# Patient Record
Sex: Female | Born: 1950
Health system: Southern US, Community
[De-identification: ages and names within clinical notes are randomized; demographics above are authoritative.]

## PROBLEM LIST (undated history)

## (undated) DIAGNOSIS — R350 Frequency of micturition: Secondary | ICD-10-CM

## (undated) DIAGNOSIS — R0600 Dyspnea, unspecified: Secondary | ICD-10-CM

## (undated) DIAGNOSIS — R011 Cardiac murmur, unspecified: Secondary | ICD-10-CM

## (undated) DIAGNOSIS — H9190 Unspecified hearing loss, unspecified ear: Secondary | ICD-10-CM

## (undated) DIAGNOSIS — Z8701 Personal history of pneumonia (recurrent): Secondary | ICD-10-CM

## (undated) DIAGNOSIS — F32A Depression, unspecified: Secondary | ICD-10-CM

## (undated) DIAGNOSIS — F329 Major depressive disorder, single episode, unspecified: Secondary | ICD-10-CM

## (undated) DIAGNOSIS — G8929 Other chronic pain: Secondary | ICD-10-CM

## (undated) DIAGNOSIS — R12 Heartburn: Secondary | ICD-10-CM

## (undated) DIAGNOSIS — R51 Headache: Secondary | ICD-10-CM

## (undated) DIAGNOSIS — F988 Other specified behavioral and emotional disorders with onset usually occurring in childhood and adolescence: Secondary | ICD-10-CM

## (undated) DIAGNOSIS — R519 Headache, unspecified: Secondary | ICD-10-CM

## (undated) DIAGNOSIS — I10 Essential (primary) hypertension: Secondary | ICD-10-CM

## (undated) DIAGNOSIS — E7800 Pure hypercholesterolemia, unspecified: Secondary | ICD-10-CM

## (undated) DIAGNOSIS — R32 Unspecified urinary incontinence: Secondary | ICD-10-CM

## (undated) DIAGNOSIS — R1310 Dysphagia, unspecified: Secondary | ICD-10-CM

## (undated) DIAGNOSIS — M1990 Unspecified osteoarthritis, unspecified site: Secondary | ICD-10-CM

## (undated) DIAGNOSIS — F419 Anxiety disorder, unspecified: Secondary | ICD-10-CM

## (undated) DIAGNOSIS — G4700 Insomnia, unspecified: Secondary | ICD-10-CM

## (undated) DIAGNOSIS — N390 Urinary tract infection, site not specified: Secondary | ICD-10-CM

## (undated) DIAGNOSIS — K589 Irritable bowel syndrome without diarrhea: Secondary | ICD-10-CM

## (undated) HISTORY — PX: CERVICAL SPINE SURGERY: SHX589

## (undated) HISTORY — PX: BREAST BIOPSY: SHX20

## (undated) HISTORY — PX: ABDOMINAL HYSTERECTOMY: SHX81

## (undated) HISTORY — PX: ROTATOR CUFF REPAIR: SHX139

## (undated) HISTORY — PX: OTHER SURGICAL HISTORY: SHX169

## (undated) HISTORY — PX: CARPAL TUNNEL RELEASE: SHX101

## (undated) HISTORY — PX: ESOPHAGOGASTRODUODENOSCOPY (EGD) WITH ESOPHAGEAL DILATION: SHX5812

---

## 2013-10-01 DIAGNOSIS — E7800 Pure hypercholesterolemia, unspecified: Secondary | ICD-10-CM | POA: Diagnosis not present

## 2013-10-01 DIAGNOSIS — F988 Other specified behavioral and emotional disorders with onset usually occurring in childhood and adolescence: Secondary | ICD-10-CM | POA: Diagnosis not present

## 2013-10-01 DIAGNOSIS — J45909 Unspecified asthma, uncomplicated: Secondary | ICD-10-CM | POA: Diagnosis not present

## 2013-10-01 DIAGNOSIS — Z23 Encounter for immunization: Secondary | ICD-10-CM | POA: Diagnosis not present

## 2013-10-01 DIAGNOSIS — Z1212 Encounter for screening for malignant neoplasm of rectum: Secondary | ICD-10-CM | POA: Diagnosis not present

## 2013-10-01 DIAGNOSIS — J449 Chronic obstructive pulmonary disease, unspecified: Secondary | ICD-10-CM | POA: Diagnosis not present

## 2013-10-01 DIAGNOSIS — E559 Vitamin D deficiency, unspecified: Secondary | ICD-10-CM | POA: Diagnosis not present

## 2013-10-01 DIAGNOSIS — I10 Essential (primary) hypertension: Secondary | ICD-10-CM | POA: Diagnosis not present

## 2013-10-01 DIAGNOSIS — Z0000 Encounter for general adult medical examination without abnormal findings: Secondary | ICD-10-CM | POA: Diagnosis not present

## 2013-10-09 DIAGNOSIS — Z780 Asymptomatic menopausal state: Secondary | ICD-10-CM | POA: Diagnosis not present

## 2013-10-09 DIAGNOSIS — Z1231 Encounter for screening mammogram for malignant neoplasm of breast: Secondary | ICD-10-CM | POA: Diagnosis not present

## 2013-10-09 DIAGNOSIS — N63 Unspecified lump in unspecified breast: Secondary | ICD-10-CM | POA: Diagnosis not present

## 2013-10-09 DIAGNOSIS — M810 Age-related osteoporosis without current pathological fracture: Secondary | ICD-10-CM | POA: Diagnosis not present

## 2013-10-22 DIAGNOSIS — R922 Inconclusive mammogram: Secondary | ICD-10-CM | POA: Diagnosis not present

## 2013-10-22 DIAGNOSIS — R928 Other abnormal and inconclusive findings on diagnostic imaging of breast: Secondary | ICD-10-CM | POA: Diagnosis not present

## 2013-10-29 DIAGNOSIS — Z803 Family history of malignant neoplasm of breast: Secondary | ICD-10-CM | POA: Diagnosis not present

## 2013-10-29 DIAGNOSIS — E663 Overweight: Secondary | ICD-10-CM | POA: Diagnosis not present

## 2013-10-29 DIAGNOSIS — R920 Mammographic microcalcification found on diagnostic imaging of breast: Secondary | ICD-10-CM | POA: Diagnosis not present

## 2013-11-14 DIAGNOSIS — H409 Unspecified glaucoma: Secondary | ICD-10-CM | POA: Diagnosis not present

## 2013-11-14 DIAGNOSIS — Z880 Allergy status to penicillin: Secondary | ICD-10-CM | POA: Diagnosis not present

## 2013-11-14 DIAGNOSIS — Z886 Allergy status to analgesic agent status: Secondary | ICD-10-CM | POA: Diagnosis not present

## 2013-11-14 DIAGNOSIS — R928 Other abnormal and inconclusive findings on diagnostic imaging of breast: Secondary | ICD-10-CM | POA: Diagnosis not present

## 2013-11-14 DIAGNOSIS — E7800 Pure hypercholesterolemia, unspecified: Secondary | ICD-10-CM | POA: Diagnosis not present

## 2013-11-14 DIAGNOSIS — F410 Panic disorder [episodic paroxysmal anxiety] without agoraphobia: Secondary | ICD-10-CM | POA: Diagnosis not present

## 2013-11-14 DIAGNOSIS — Z881 Allergy status to other antibiotic agents status: Secondary | ICD-10-CM | POA: Diagnosis not present

## 2013-11-14 DIAGNOSIS — F3289 Other specified depressive episodes: Secondary | ICD-10-CM | POA: Diagnosis not present

## 2013-11-14 DIAGNOSIS — I10 Essential (primary) hypertension: Secondary | ICD-10-CM | POA: Diagnosis not present

## 2013-11-14 DIAGNOSIS — Z79899 Other long term (current) drug therapy: Secondary | ICD-10-CM | POA: Diagnosis not present

## 2013-11-14 DIAGNOSIS — K589 Irritable bowel syndrome without diarrhea: Secondary | ICD-10-CM | POA: Diagnosis not present

## 2013-11-14 DIAGNOSIS — M129 Arthropathy, unspecified: Secondary | ICD-10-CM | POA: Diagnosis not present

## 2013-11-14 DIAGNOSIS — R0602 Shortness of breath: Secondary | ICD-10-CM | POA: Diagnosis not present

## 2013-11-14 DIAGNOSIS — F988 Other specified behavioral and emotional disorders with onset usually occurring in childhood and adolescence: Secondary | ICD-10-CM | POA: Diagnosis not present

## 2013-11-14 DIAGNOSIS — Z888 Allergy status to other drugs, medicaments and biological substances status: Secondary | ICD-10-CM | POA: Diagnosis not present

## 2013-11-14 DIAGNOSIS — Z90710 Acquired absence of both cervix and uterus: Secondary | ICD-10-CM | POA: Diagnosis not present

## 2013-11-14 DIAGNOSIS — M549 Dorsalgia, unspecified: Secondary | ICD-10-CM | POA: Diagnosis not present

## 2013-11-14 DIAGNOSIS — N6019 Diffuse cystic mastopathy of unspecified breast: Secondary | ICD-10-CM | POA: Diagnosis not present

## 2013-11-14 DIAGNOSIS — G43909 Migraine, unspecified, not intractable, without status migrainosus: Secondary | ICD-10-CM | POA: Diagnosis not present

## 2013-11-14 DIAGNOSIS — R920 Mammographic microcalcification found on diagnostic imaging of breast: Secondary | ICD-10-CM | POA: Diagnosis not present

## 2013-11-14 DIAGNOSIS — F329 Major depressive disorder, single episode, unspecified: Secondary | ICD-10-CM | POA: Diagnosis not present

## 2013-11-28 DIAGNOSIS — L258 Unspecified contact dermatitis due to other agents: Secondary | ICD-10-CM | POA: Diagnosis not present

## 2014-01-16 DIAGNOSIS — K219 Gastro-esophageal reflux disease without esophagitis: Secondary | ICD-10-CM | POA: Diagnosis not present

## 2014-03-17 DIAGNOSIS — F988 Other specified behavioral and emotional disorders with onset usually occurring in childhood and adolescence: Secondary | ICD-10-CM | POA: Diagnosis not present

## 2014-03-17 DIAGNOSIS — K219 Gastro-esophageal reflux disease without esophagitis: Secondary | ICD-10-CM | POA: Diagnosis not present

## 2014-03-17 DIAGNOSIS — I10 Essential (primary) hypertension: Secondary | ICD-10-CM | POA: Diagnosis not present

## 2014-03-17 DIAGNOSIS — Z09 Encounter for follow-up examination after completed treatment for conditions other than malignant neoplasm: Secondary | ICD-10-CM | POA: Diagnosis not present

## 2014-07-22 DIAGNOSIS — K219 Gastro-esophageal reflux disease without esophagitis: Secondary | ICD-10-CM | POA: Diagnosis not present

## 2014-07-22 DIAGNOSIS — F900 Attention-deficit hyperactivity disorder, predominantly inattentive type: Secondary | ICD-10-CM | POA: Diagnosis not present

## 2014-07-22 DIAGNOSIS — I10 Essential (primary) hypertension: Secondary | ICD-10-CM | POA: Diagnosis not present

## 2014-07-22 DIAGNOSIS — F419 Anxiety disorder, unspecified: Secondary | ICD-10-CM | POA: Diagnosis not present

## 2014-07-23 DIAGNOSIS — F419 Anxiety disorder, unspecified: Secondary | ICD-10-CM | POA: Diagnosis not present

## 2014-07-23 DIAGNOSIS — R51 Headache: Secondary | ICD-10-CM | POA: Diagnosis not present

## 2014-08-28 DIAGNOSIS — H409 Unspecified glaucoma: Secondary | ICD-10-CM | POA: Diagnosis not present

## 2014-08-28 DIAGNOSIS — M545 Low back pain: Secondary | ICD-10-CM | POA: Diagnosis not present

## 2014-08-28 DIAGNOSIS — K589 Irritable bowel syndrome without diarrhea: Secondary | ICD-10-CM | POA: Diagnosis not present

## 2014-08-28 DIAGNOSIS — E782 Mixed hyperlipidemia: Secondary | ICD-10-CM | POA: Diagnosis not present

## 2014-08-28 DIAGNOSIS — I10 Essential (primary) hypertension: Secondary | ICD-10-CM | POA: Diagnosis not present

## 2014-08-28 DIAGNOSIS — F419 Anxiety disorder, unspecified: Secondary | ICD-10-CM | POA: Diagnosis not present

## 2014-08-28 DIAGNOSIS — Z87898 Personal history of other specified conditions: Secondary | ICD-10-CM | POA: Diagnosis not present

## 2014-08-28 DIAGNOSIS — F900 Attention-deficit hyperactivity disorder, predominantly inattentive type: Secondary | ICD-10-CM | POA: Diagnosis not present

## 2014-08-28 DIAGNOSIS — H9313 Tinnitus, bilateral: Secondary | ICD-10-CM | POA: Diagnosis not present

## 2014-08-28 DIAGNOSIS — K1379 Other lesions of oral mucosa: Secondary | ICD-10-CM | POA: Diagnosis not present

## 2014-09-19 DIAGNOSIS — I10 Essential (primary) hypertension: Secondary | ICD-10-CM | POA: Diagnosis not present

## 2014-09-19 DIAGNOSIS — E782 Mixed hyperlipidemia: Secondary | ICD-10-CM | POA: Diagnosis not present

## 2014-10-23 DIAGNOSIS — R921 Mammographic calcification found on diagnostic imaging of breast: Secondary | ICD-10-CM | POA: Diagnosis not present

## 2014-10-23 DIAGNOSIS — Z803 Family history of malignant neoplasm of breast: Secondary | ICD-10-CM | POA: Diagnosis not present

## 2014-12-04 DIAGNOSIS — K1379 Other lesions of oral mucosa: Secondary | ICD-10-CM | POA: Diagnosis not present

## 2014-12-04 DIAGNOSIS — E782 Mixed hyperlipidemia: Secondary | ICD-10-CM | POA: Diagnosis not present

## 2014-12-04 DIAGNOSIS — M545 Low back pain: Secondary | ICD-10-CM | POA: Diagnosis not present

## 2014-12-04 DIAGNOSIS — I10 Essential (primary) hypertension: Secondary | ICD-10-CM | POA: Diagnosis not present

## 2014-12-04 DIAGNOSIS — F419 Anxiety disorder, unspecified: Secondary | ICD-10-CM | POA: Diagnosis not present

## 2015-01-29 DIAGNOSIS — H00012 Hordeolum externum right lower eyelid: Secondary | ICD-10-CM | POA: Diagnosis not present

## 2015-01-29 DIAGNOSIS — F419 Anxiety disorder, unspecified: Secondary | ICD-10-CM | POA: Diagnosis not present

## 2015-05-13 DIAGNOSIS — I10 Essential (primary) hypertension: Secondary | ICD-10-CM | POA: Diagnosis not present

## 2015-05-13 DIAGNOSIS — F419 Anxiety disorder, unspecified: Secondary | ICD-10-CM | POA: Diagnosis not present

## 2015-05-13 DIAGNOSIS — F900 Attention-deficit hyperactivity disorder, predominantly inattentive type: Secondary | ICD-10-CM | POA: Diagnosis not present

## 2015-05-13 DIAGNOSIS — K1379 Other lesions of oral mucosa: Secondary | ICD-10-CM | POA: Diagnosis not present

## 2015-05-13 DIAGNOSIS — M545 Low back pain: Secondary | ICD-10-CM | POA: Diagnosis not present

## 2015-05-13 DIAGNOSIS — E782 Mixed hyperlipidemia: Secondary | ICD-10-CM | POA: Diagnosis not present

## 2015-09-14 DIAGNOSIS — F419 Anxiety disorder, unspecified: Secondary | ICD-10-CM | POA: Diagnosis not present

## 2015-09-14 DIAGNOSIS — G4700 Insomnia, unspecified: Secondary | ICD-10-CM | POA: Diagnosis not present

## 2015-09-14 DIAGNOSIS — M545 Low back pain: Secondary | ICD-10-CM | POA: Diagnosis not present

## 2015-09-14 DIAGNOSIS — I10 Essential (primary) hypertension: Secondary | ICD-10-CM | POA: Diagnosis not present

## 2015-09-14 DIAGNOSIS — E782 Mixed hyperlipidemia: Secondary | ICD-10-CM | POA: Diagnosis not present

## 2015-09-14 DIAGNOSIS — K1379 Other lesions of oral mucosa: Secondary | ICD-10-CM | POA: Diagnosis not present

## 2015-09-14 DIAGNOSIS — F900 Attention-deficit hyperactivity disorder, predominantly inattentive type: Secondary | ICD-10-CM | POA: Diagnosis not present

## 2015-10-08 DIAGNOSIS — M545 Low back pain: Secondary | ICD-10-CM | POA: Diagnosis not present

## 2015-10-08 DIAGNOSIS — M6281 Muscle weakness (generalized): Secondary | ICD-10-CM | POA: Diagnosis not present

## 2015-10-08 DIAGNOSIS — M799 Soft tissue disorder, unspecified: Secondary | ICD-10-CM | POA: Diagnosis not present

## 2015-10-08 DIAGNOSIS — M62830 Muscle spasm of back: Secondary | ICD-10-CM | POA: Diagnosis not present

## 2016-04-25 DIAGNOSIS — E782 Mixed hyperlipidemia: Secondary | ICD-10-CM | POA: Diagnosis not present

## 2016-04-25 DIAGNOSIS — M25512 Pain in left shoulder: Secondary | ICD-10-CM | POA: Diagnosis not present

## 2016-04-25 DIAGNOSIS — F419 Anxiety disorder, unspecified: Secondary | ICD-10-CM | POA: Diagnosis not present

## 2016-04-25 DIAGNOSIS — B354 Tinea corporis: Secondary | ICD-10-CM | POA: Diagnosis not present

## 2016-04-25 DIAGNOSIS — I10 Essential (primary) hypertension: Secondary | ICD-10-CM | POA: Diagnosis not present

## 2016-04-25 DIAGNOSIS — F900 Attention-deficit hyperactivity disorder, predominantly inattentive type: Secondary | ICD-10-CM | POA: Diagnosis not present

## 2016-04-25 DIAGNOSIS — R1313 Dysphagia, pharyngeal phase: Secondary | ICD-10-CM | POA: Diagnosis not present

## 2016-04-25 DIAGNOSIS — F320 Major depressive disorder, single episode, mild: Secondary | ICD-10-CM | POA: Diagnosis not present

## 2016-04-25 DIAGNOSIS — R630 Anorexia: Secondary | ICD-10-CM | POA: Diagnosis not present

## 2016-05-01 DIAGNOSIS — N390 Urinary tract infection, site not specified: Secondary | ICD-10-CM | POA: Diagnosis not present

## 2016-05-01 DIAGNOSIS — R319 Hematuria, unspecified: Secondary | ICD-10-CM | POA: Diagnosis not present

## 2016-05-03 ENCOUNTER — Ambulatory Visit
Admission: RE | Admit: 2016-05-03 | Discharge: 2016-05-03 | Disposition: A | Discharge status: Home / Self Care | Payer: Medicare Other | Source: Ambulatory Visit | Attending: Family Medicine | Admitting: Family Medicine

## 2016-05-03 ENCOUNTER — Other Ambulatory Visit: Payer: Self-pay | Admitting: Family Medicine

## 2016-05-03 DIAGNOSIS — M19012 Primary osteoarthritis, left shoulder: Secondary | ICD-10-CM | POA: Diagnosis not present

## 2016-05-03 DIAGNOSIS — M25512 Pain in left shoulder: Secondary | ICD-10-CM

## 2016-05-18 DIAGNOSIS — M19012 Primary osteoarthritis, left shoulder: Secondary | ICD-10-CM | POA: Diagnosis not present

## 2016-05-27 DIAGNOSIS — M25512 Pain in left shoulder: Secondary | ICD-10-CM | POA: Diagnosis not present

## 2016-06-02 ENCOUNTER — Other Ambulatory Visit (HOSPITAL_COMMUNITY): Payer: Self-pay | Admitting: Gastroenterology

## 2016-06-02 DIAGNOSIS — R1314 Dysphagia, pharyngoesophageal phase: Secondary | ICD-10-CM | POA: Diagnosis not present

## 2016-06-02 DIAGNOSIS — Z1211 Encounter for screening for malignant neoplasm of colon: Secondary | ICD-10-CM | POA: Diagnosis not present

## 2016-06-02 DIAGNOSIS — R1312 Dysphagia, oropharyngeal phase: Secondary | ICD-10-CM | POA: Diagnosis not present

## 2016-06-07 DIAGNOSIS — M25512 Pain in left shoulder: Secondary | ICD-10-CM | POA: Diagnosis not present

## 2016-06-07 DIAGNOSIS — M19012 Primary osteoarthritis, left shoulder: Secondary | ICD-10-CM | POA: Diagnosis not present

## 2016-06-15 ENCOUNTER — Ambulatory Visit (HOSPITAL_COMMUNITY): Admission: RE | Admit: 2016-06-15 | Payer: Medicare Other | Source: Ambulatory Visit

## 2016-07-05 NOTE — H&P (Signed)
  Antionette CharKaren Deyarmin is an 65 y.o. female.    Chief Complaint: left shoulder pain  HPI: Pt is a 65 y.o. female complaining of left shoulder pain for multiple years. Pain had continually increased since the beginning. X-rays in the clinic show end-stage arthritic changes of the left shoulder. Pt has tried various conservative treatments which have failed to alleviate their symptoms, including injections and therapy. Various options are discussed with the patient. Risks, benefits and expectations were discussed with the patient. Patient understand the risks, benefits and expectations and wishes to proceed with surgery.   PCP:  No primary care provider on file.  D/C Plans: Home  PMH: No past medical history on file.  PSH: No past surgical history on file.  Social History:  has no tobacco, alcohol, and drug history on file.  Allergies:  Allergies not on file  Medications: No current facility-administered medications for this encounter.    No current outpatient prescriptions on file.    No results found for this or any previous visit (from the past 48 hour(s)). No results found.  ROS: Pain with rom of the left upper extremity  Physical Exam:  Alert and oriented 65 y.o. female in no acute distress Cranial nerves 2-12 intact Cervical spine: full rom with no tenderness, nv intact distally Chest: active breath sounds bilaterally, no wheeze rhonchi or rales Heart: regular rate and rhythm, no murmur Abd: non tender non distended with active bowel sounds Hip is stable with rom  Left shoulder moderate crepitus with rom nv intact distally No rashes or edema Strength of ER and IR 4/5   Assessment/Plan Assessment: left shoulder end stage osteoarthritis  Plan: Patient will undergo a left total shoulder arthroplasty by Dr. Ranell [facility]Norris at [facility]. Risks benefits and expectations were discussed with the patient. Patient understand risks, benefits and expectations and wishes to  proceed.

## 2016-07-06 ENCOUNTER — Ambulatory Visit (HOSPITAL_COMMUNITY): Payer: Medicare Other

## 2016-07-07 ENCOUNTER — Ambulatory Visit (HOSPITAL_COMMUNITY)
Admission: RE | Admit: 2016-07-07 | Discharge: 2016-07-07 | Disposition: A | Discharge status: Home / Self Care | Payer: Medicare Other | Source: Ambulatory Visit | Attending: Gastroenterology | Admitting: Gastroenterology

## 2016-07-07 ENCOUNTER — Encounter (HOSPITAL_COMMUNITY): Payer: Self-pay | Admitting: Radiology

## 2016-07-07 DIAGNOSIS — K224 Dyskinesia of esophagus: Secondary | ICD-10-CM | POA: Insufficient documentation

## 2016-07-07 DIAGNOSIS — R1314 Dysphagia, pharyngoesophageal phase: Secondary | ICD-10-CM | POA: Diagnosis not present

## 2016-07-11 ENCOUNTER — Encounter (HOSPITAL_COMMUNITY)
Admission: RE | Admit: 2016-07-11 | Discharge: 2016-07-11 | Disposition: A | Discharge status: Home / Self Care | Payer: Medicare Other | Source: Ambulatory Visit | Attending: Orthopedic Surgery | Admitting: Orthopedic Surgery

## 2016-07-11 ENCOUNTER — Encounter (HOSPITAL_COMMUNITY): Payer: Self-pay

## 2016-07-11 DIAGNOSIS — Z79899 Other long term (current) drug therapy: Secondary | ICD-10-CM | POA: Diagnosis not present

## 2016-07-11 DIAGNOSIS — F418 Other specified anxiety disorders: Secondary | ICD-10-CM | POA: Diagnosis not present

## 2016-07-11 DIAGNOSIS — K224 Dyskinesia of esophagus: Secondary | ICD-10-CM | POA: Diagnosis not present

## 2016-07-11 DIAGNOSIS — M19012 Primary osteoarthritis, left shoulder: Secondary | ICD-10-CM | POA: Diagnosis not present

## 2016-07-11 DIAGNOSIS — Z981 Arthrodesis status: Secondary | ICD-10-CM | POA: Diagnosis not present

## 2016-07-11 DIAGNOSIS — I10 Essential (primary) hypertension: Secondary | ICD-10-CM | POA: Diagnosis not present

## 2016-07-11 DIAGNOSIS — Z87891 Personal history of nicotine dependence: Secondary | ICD-10-CM | POA: Diagnosis not present

## 2016-07-11 HISTORY — DX: Insomnia, unspecified: G47.00

## 2016-07-11 HISTORY — DX: Frequency of micturition: R35.0

## 2016-07-11 HISTORY — DX: Anxiety disorder, unspecified: F41.9

## 2016-07-11 HISTORY — DX: Other chronic pain: G89.29

## 2016-07-11 HISTORY — DX: Depression, unspecified: F32.A

## 2016-07-11 HISTORY — DX: Irritable bowel syndrome without diarrhea: K58.9

## 2016-07-11 HISTORY — DX: Personal history of pneumonia (recurrent): Z87.01

## 2016-07-11 HISTORY — DX: Essential (primary) hypertension: I10

## 2016-07-11 HISTORY — DX: Heartburn: R12

## 2016-07-11 HISTORY — DX: Unspecified hearing loss, unspecified ear: H91.90

## 2016-07-11 HISTORY — DX: Headache: R51

## 2016-07-11 HISTORY — DX: Pure hypercholesterolemia, unspecified: E78.00

## 2016-07-11 HISTORY — DX: Headache, unspecified: R51.9

## 2016-07-11 HISTORY — DX: Unspecified osteoarthritis, unspecified site: M19.90

## 2016-07-11 HISTORY — DX: Major depressive disorder, single episode, unspecified: F32.9

## 2016-07-11 HISTORY — DX: Other specified behavioral and emotional disorders with onset usually occurring in childhood and adolescence: F98.8

## 2016-07-11 HISTORY — DX: Dysphagia, unspecified: R13.10

## 2016-07-11 HISTORY — DX: Urinary tract infection, site not specified: N39.0

## 2016-07-11 HISTORY — DX: Dyspnea, unspecified: R06.00

## 2016-07-11 HISTORY — DX: Cardiac murmur, unspecified: R01.1

## 2016-07-11 HISTORY — DX: Unspecified urinary incontinence: R32

## 2016-07-11 LAB — BASIC METABOLIC PANEL
Anion gap: 7 (ref 5–15)
BUN: 11 mg/dL (ref 6–20)
CALCIUM: 9.5 mg/dL (ref 8.9–10.3)
CO2: 26 mmol/L (ref 22–32)
CREATININE: 0.76 mg/dL (ref 0.44–1.00)
Chloride: 109 mmol/L (ref 101–111)
Glucose, Bld: 84 mg/dL (ref 65–99)
Potassium: 3.6 mmol/L (ref 3.5–5.1)
Sodium: 142 mmol/L (ref 135–145)

## 2016-07-11 LAB — SURGICAL PCR SCREEN
MRSA, PCR: NEGATIVE
Staphylococcus aureus: NEGATIVE

## 2016-07-11 LAB — CBC
HCT: 35.4 % — ABNORMAL LOW (ref 36.0–46.0)
Hemoglobin: 11.6 g/dL — ABNORMAL LOW (ref 12.0–15.0)
MCH: 29 pg (ref 26.0–34.0)
MCHC: 32.8 g/dL (ref 30.0–36.0)
MCV: 88.5 fL (ref 78.0–100.0)
PLATELETS: 334 10*3/uL (ref 150–400)
RBC: 4 MIL/uL (ref 3.87–5.11)
RDW: 13.3 % (ref 11.5–15.5)
WBC: 7 10*3/uL (ref 4.0–10.5)

## 2016-07-11 NOTE — Progress Notes (Signed)
PCP - Dr. Johny BlamerWilliam [name] Cardiologist - denies  EKG - 07/11/16 CXR - denies  Echo/stress test/cardiac cath - denies  Patient denies chest pain and shortness of breath at PAT appointment.

## 2016-07-11 NOTE — Progress Notes (Signed)
Anesthesia Chart Review: Patient is a 65 year old female scheduled for left total shoulder arthroplasty on 07/12/16 by Dr. Ranell PatrickNorris.  History includes former smoker, dysphagia (since 2000 ACDF and is s/p multiple "42" esophageal dilations), HTN, murmur (not specified), anxiety, depression, ADD, hypercholesterolemia, insomnia, IBS, chronic pain, dyspnea, hard of hearing (right ear), hysterectomy.  PCP is listed as Dr. Johny BlamerWilliam Harris. She previously lived in [place], and moved to [place] about three years ago.  Meds include Xanax, Adderall, atenolol, Cardizem CD, Lexapro, Estrace, Tricor, Norco, Levbid, magnesium, melatonin, fish oil, Ditropan, Zanaflex, Zantac, potassium, Sudafed PE.   BP 132/72   Pulse 60   Temp 36.6 C (Oral)   Resp 18   Ht 5\' 1"  (1.549 m)   Wt 118 lb 2.7 oz (53.6 kg)   SpO2 99%   BMI 22.33 kg/m   07/11/16 EKG: SB at 50 bpm, LAD.  07/07/16 Esophogram/Barium Swallow: IMPRESSION: 1. Significant esophageal dysmotility and moderate esophageal stasis. 2. No hiatal hernia or GE reflux. 3. Cervical fusion hardware in place but no complicating features. No upper esophageal stricture.  Preoperative labs noted.   She has a history of multiple esophageal dilations following ACDF. Reports she is prone to thick secretions, but is not acutely ill. Further evaluation on the day of surgery by her anesthesiologist to determine the definitive anesthesia plan.  Velna Ochsllison Zelenak, PA-C [facility] Short Stay Center/Anesthesiology Phone [phone] 07/11/2016 4:14 PM

## 2016-07-11 NOTE — Pre-Procedure Instructions (Signed)
Sydney [name]  07/11/2016      [id number] - [place], [place] - [address] [place] [address] Phone: [phone] Fax: [phone]    Your procedure is scheduled on Tuesday, December 5th, 2017.  [facility] Admitting at 10:30 A.M.   Call this number if you have problems the morning of surgery:  [phone]   Remember:  Do not eat food or drink liquids after midnight.   Take these medicines the morning of surgery with A SIP OF WATER: Acetaminophen (Tylenol) if needed, Alprazolam (Xanax), Atenolol (Tenormin), Diltiazem (Cardizem), Escitalopram (Lexapro), Hydrocodone-acetaminophen (Norco/Vicodin), Ranitidine (Zantac), Tizanidine (Zanaflex) if needed.  Stop taking: Phenylephrine (Sudafed PE), Aspirin, NSAIDS, Aleve, Naproxen, Ibuprofen, Advil, Motrin, BC's, Goody's, Fish oil, all herbal medications, and all vitamins.     Do not wear jewelry, make-up or nail polish.  Do not wear lotions, powders, or perfumes, or deoderant.  Do not shave 48 hours prior to surgery.   Do not bring valuables to the hospital.  [facility] is not responsible for any belongings or valuables.  Contacts, dentures or bridgework may not be worn into surgery.  Leave your suitcase in the car.  After surgery it may be brought to your room.  For patients admitted to the hospital, discharge time will be determined by your treatment team.  Patients discharged the day of surgery will not be allowed to drive home.   Special instructions:  Preparing for Surgery.   [place]- Preparing For Surgery  Before surgery, you can play an important role. Because skin is not sterile, your skin needs to be as free of germs as possible. You can reduce the number of germs on your skin by washing with CHG (chlorahexidine gluconate) Soap before surgery.  CHG is an antiseptic cleaner which kills germs and bonds with the skin to continue killing germs even after  washing.  Please do not use if you have an allergy to CHG or antibacterial soaps. If your skin becomes reddened/irritated stop using the CHG.  Do not shave (including legs and underarms) for at least 48 hours prior to first CHG shower. It is OK to shave your face.  Please follow these instructions carefully.   1. Shower the NIGHT BEFORE SURGERY and the MORNING OF SURGERY with CHG.   2. If you chose to wash your hair, wash your hair first as usual with your normal shampoo.  3. After you shampoo, rinse your hair and body thoroughly to remove the shampoo.  4. Use CHG as you would any other liquid soap. You can apply CHG directly to the skin and wash gently with a scrungie or a clean washcloth.   5. Apply the CHG Soap to your body ONLY FROM THE NECK DOWN.  Do not use on open wounds or open sores. Avoid contact with your eyes, ears, mouth and genitals (private parts). Wash genitals (private parts) with your normal soap.  6. Wash thoroughly, paying special attention to the area where your surgery will be performed.  7. Thoroughly rinse your body with warm water from the neck down.  8. DO NOT shower/wash with your normal soap after using and rinsing off the CHG Soap.  9. Pat yourself dry with a CLEAN TOWEL.   10. Wear CLEAN PAJAMAS   11. Place CLEAN SHEETS on your bed the night of your first shower and DO NOT SLEEP WITH PETS.  Day of Surgery: Do not apply any deodorants/lotions. Please wear clean  clothes to the hospital/surgery center.     Please read over the following fact sheets that you were given. MRSA Information

## 2016-07-12 ENCOUNTER — Encounter (HOSPITAL_COMMUNITY)
Admission: RE | Disposition: A | Discharge status: Home Health | Payer: Self-pay | Source: Ambulatory Visit | Attending: Orthopedic Surgery

## 2016-07-12 ENCOUNTER — Inpatient Hospital Stay (HOSPITAL_COMMUNITY): Payer: Medicare Other

## 2016-07-12 ENCOUNTER — Inpatient Hospital Stay (HOSPITAL_COMMUNITY): Payer: Medicare Other | Admitting: Vascular Surgery

## 2016-07-12 ENCOUNTER — Inpatient Hospital Stay (HOSPITAL_COMMUNITY)
Admission: RE | Admit: 2016-07-12 | Discharge: 2016-07-13 | DRG: 483 | Disposition: A | Discharge status: Home Health | Payer: Medicare Other | Source: Ambulatory Visit | Attending: Orthopedic Surgery | Admitting: Orthopedic Surgery

## 2016-07-12 DIAGNOSIS — K224 Dyskinesia of esophagus: Secondary | ICD-10-CM | POA: Diagnosis not present

## 2016-07-12 DIAGNOSIS — F418 Other specified anxiety disorders: Secondary | ICD-10-CM | POA: Diagnosis not present

## 2016-07-12 DIAGNOSIS — Z96612 Presence of left artificial shoulder joint: Secondary | ICD-10-CM

## 2016-07-12 DIAGNOSIS — Z981 Arthrodesis status: Secondary | ICD-10-CM | POA: Diagnosis not present

## 2016-07-12 DIAGNOSIS — M25512 Pain in left shoulder: Secondary | ICD-10-CM | POA: Diagnosis not present

## 2016-07-12 DIAGNOSIS — M19012 Primary osteoarthritis, left shoulder: Principal | ICD-10-CM | POA: Diagnosis present

## 2016-07-12 DIAGNOSIS — G8918 Other acute postprocedural pain: Secondary | ICD-10-CM | POA: Diagnosis not present

## 2016-07-12 DIAGNOSIS — I10 Essential (primary) hypertension: Secondary | ICD-10-CM | POA: Diagnosis not present

## 2016-07-12 DIAGNOSIS — Z471 Aftercare following joint replacement surgery: Secondary | ICD-10-CM | POA: Diagnosis not present

## 2016-07-12 DIAGNOSIS — R262 Difficulty in walking, not elsewhere classified: Secondary | ICD-10-CM

## 2016-07-12 DIAGNOSIS — Z79899 Other long term (current) drug therapy: Secondary | ICD-10-CM | POA: Diagnosis not present

## 2016-07-12 DIAGNOSIS — Z87891 Personal history of nicotine dependence: Secondary | ICD-10-CM | POA: Diagnosis not present

## 2016-07-12 HISTORY — PX: TOTAL SHOULDER ARTHROPLASTY: SHX126

## 2016-07-12 SURGERY — ARTHROPLASTY, SHOULDER, TOTAL
Anesthesia: Regional | Site: Shoulder | Laterality: Left

## 2016-07-12 MED ORDER — CHLORHEXIDINE GLUCONATE 4 % EX LIQD: 60.0000 mL | Freq: Once | CUTANEOUS | Status: DC

## 2016-07-12 MED ORDER — FENTANYL CITRATE (PF) 100 MCG/2ML IJ SOLN
INTRAMUSCULAR | Status: AC
  Filled 2016-07-12: qty 2

## 2016-07-12 MED ORDER — 0.9 % SODIUM CHLORIDE (POUR BTL) OPTIME
TOPICAL | Status: DC | PRN
  Administered 2016-07-12: 1000 mL

## 2016-07-12 MED ORDER — HYOSCYAMINE SULFATE ER 0.375 MG PO TB12
0.3750 mg | ORAL_TABLET | Freq: Two times a day (BID) | ORAL | Status: DC
  Administered 2016-07-12 – 2016-07-13 (×2): 0.375 mg via ORAL
  Filled 2016-07-12 (×3): qty 1

## 2016-07-12 MED ORDER — PHENYLEPHRINE HCL 10 MG/ML IJ SOLN
INTRAMUSCULAR | Status: AC
  Filled 2016-07-12: qty 2

## 2016-07-12 MED ORDER — ACETAMINOPHEN 500 MG PO TABS: 500.0000 mg | ORAL_TABLET | Freq: Four times a day (QID) | ORAL | Status: DC | PRN

## 2016-07-12 MED ORDER — VITAMIN D 1000 UNITS PO TABS
1000.0000 [IU] | ORAL_TABLET | Freq: Every day | ORAL | Status: DC
  Administered 2016-07-12 – 2016-07-13 (×2): 1000 [IU] via ORAL
  Filled 2016-07-12 (×2): qty 1

## 2016-07-12 MED ORDER — MIDAZOLAM HCL 2 MG/2ML IJ SOLN
INTRAMUSCULAR | Status: AC
  Filled 2016-07-12: qty 2

## 2016-07-12 MED ORDER — ESCITALOPRAM OXALATE 10 MG PO TABS
20.0000 mg | ORAL_TABLET | Freq: Every day | ORAL | Status: DC
  Administered 2016-07-13: 20 mg via ORAL
  Filled 2016-07-12: qty 2

## 2016-07-12 MED ORDER — BUPIVACAINE-EPINEPHRINE 0.25% -1:200000 IJ SOLN
INTRAMUSCULAR | Status: DC | PRN
  Administered 2016-07-12: 5 mL

## 2016-07-12 MED ORDER — ATENOLOL 50 MG PO TABS
50.0000 mg | ORAL_TABLET | Freq: Every day | ORAL | Status: DC
  Administered 2016-07-13: 50 mg via ORAL
  Filled 2016-07-12: qty 1

## 2016-07-12 MED ORDER — AMPHETAMINE-DEXTROAMPHETAMINE 10 MG PO TABS
10.0000 mg | ORAL_TABLET | Freq: Two times a day (BID) | ORAL | Status: DC
  Administered 2016-07-12: 10 mg via ORAL
  Filled 2016-07-12: qty 1

## 2016-07-12 MED ORDER — GELATIN ABSORBABLE 12-7 MM EX MISC
CUTANEOUS | Status: DC | PRN
  Administered 2016-07-12: 1

## 2016-07-12 MED ORDER — CEFAZOLIN SODIUM-DEXTROSE 2-4 GM/100ML-% IV SOLN
2.0000 g | INTRAVENOUS | Status: AC
  Administered 2016-07-12: 2 g via INTRAVENOUS

## 2016-07-12 MED ORDER — BUPIVACAINE-EPINEPHRINE (PF) 0.5% -1:200000 IJ SOLN
INTRAMUSCULAR | Status: DC | PRN
  Administered 2016-07-12: 20 mL via PERINEURAL

## 2016-07-12 MED ORDER — TIZANIDINE HCL 4 MG PO TABS: 4.0000 mg | ORAL_TABLET | Freq: Four times a day (QID) | ORAL | Status: DC | PRN

## 2016-07-12 MED ORDER — MAGNESIUM 200 MG PO TABS
200.0000 mg | ORAL_TABLET | Freq: Every day | ORAL | Status: DC
  Administered 2016-07-13: 200 mg via ORAL
  Filled 2016-07-12 (×2): qty 1

## 2016-07-12 MED ORDER — ALPRAZOLAM 0.25 MG PO TABS: 0.2500 mg | ORAL_TABLET | Freq: Two times a day (BID) | ORAL | Status: DC | PRN

## 2016-07-12 MED ORDER — MELATONIN 3 MG PO TABS
3.0000 mg | ORAL_TABLET | Freq: Every day | ORAL | Status: DC
  Administered 2016-07-12: 3 mg via ORAL
  Filled 2016-07-12 (×2): qty 1

## 2016-07-12 MED ORDER — CEFAZOLIN SODIUM-DEXTROSE 2-4 GM/100ML-% IV SOLN
2.0000 g | Freq: Four times a day (QID) | INTRAVENOUS | Status: AC
  Administered 2016-07-12 – 2016-07-13 (×3): 2 g via INTRAVENOUS
  Filled 2016-07-12 (×3): qty 100

## 2016-07-12 MED ORDER — GLYCOPYRROLATE 0.2 MG/ML IJ SOLN
INTRAMUSCULAR | Status: DC | PRN
  Administered 2016-07-12: 0.2 mg via INTRAVENOUS

## 2016-07-12 MED ORDER — ONDANSETRON HCL 4 MG PO TABS: 4.0000 mg | ORAL_TABLET | Freq: Four times a day (QID) | ORAL | Status: DC | PRN

## 2016-07-12 MED ORDER — AMPHETAMINE-DEXTROAMPHETAMINE 10 MG PO TABS: 10.0000 mg | ORAL_TABLET | ORAL | Status: DC

## 2016-07-12 MED ORDER — MIDAZOLAM HCL 2 MG/2ML IJ SOLN
INTRAMUSCULAR | Status: AC
  Administered 2016-07-12: 2 mg
  Filled 2016-07-12: qty 2

## 2016-07-12 MED ORDER — METOCLOPRAMIDE HCL 5 MG/ML IJ SOLN: 5.0000 mg | Freq: Three times a day (TID) | INTRAMUSCULAR | Status: DC | PRN

## 2016-07-12 MED ORDER — CEFAZOLIN SODIUM-DEXTROSE 2-4 GM/100ML-% IV SOLN
INTRAVENOUS | Status: AC
  Filled 2016-07-12: qty 100

## 2016-07-12 MED ORDER — MENTHOL 3 MG MT LOZG: 1.0000 | LOZENGE | OROMUCOSAL | Status: DC | PRN

## 2016-07-12 MED ORDER — FAMOTIDINE 20 MG PO TABS
20.0000 mg | ORAL_TABLET | Freq: Two times a day (BID) | ORAL | Status: DC
  Administered 2016-07-12 – 2016-07-13 (×2): 20 mg via ORAL
  Filled 2016-07-12 (×2): qty 1

## 2016-07-12 MED ORDER — ONDANSETRON HCL 4 MG/2ML IJ SOLN
INTRAMUSCULAR | Status: AC
  Filled 2016-07-12: qty 2

## 2016-07-12 MED ORDER — TIZANIDINE HCL 4 MG PO TABS: 4.0000 mg | ORAL_TABLET | Freq: Four times a day (QID) | ORAL | 0 refills | Status: AC | PRN

## 2016-07-12 MED ORDER — HYDROMORPHONE HCL 1 MG/ML IJ SOLN: 0.2500 mg | INTRAMUSCULAR | Status: DC | PRN

## 2016-07-12 MED ORDER — DILTIAZEM HCL ER COATED BEADS 240 MG PO CP24
240.0000 mg | ORAL_CAPSULE | Freq: Every day | ORAL | Status: DC
Start: 2016-07-13 — End: 2016-07-13
  Administered 2016-07-13: 240 mg via ORAL
  Filled 2016-07-12: qty 1

## 2016-07-12 MED ORDER — ADULT MULTIVITAMIN W/MINERALS CH
1.0000 | ORAL_TABLET | Freq: Every day | ORAL | Status: DC
  Administered 2016-07-12 – 2016-07-13 (×2): 1 via ORAL
  Filled 2016-07-12 (×2): qty 1

## 2016-07-12 MED ORDER — ALBUTEROL SULFATE HFA 108 (90 BASE) MCG/ACT IN AERS
INHALATION_SPRAY | RESPIRATORY_TRACT | Status: DC | PRN
  Administered 2016-07-12 (×2): 2 via RESPIRATORY_TRACT

## 2016-07-12 MED ORDER — ACETAMINOPHEN 650 MG RE SUPP: 650.0000 mg | Freq: Four times a day (QID) | RECTAL | Status: DC | PRN

## 2016-07-12 MED ORDER — POTASSIUM 99 MG PO TABS: 99.0000 mg | ORAL_TABLET | Freq: Every day | ORAL | Status: DC

## 2016-07-12 MED ORDER — GLYCOPYRROLATE 0.2 MG/ML IV SOSY
PREFILLED_SYRINGE | INTRAVENOUS | Status: AC
  Filled 2016-07-12: qty 3

## 2016-07-12 MED ORDER — ESTRADIOL 1 MG PO TABS
1.0000 mg | ORAL_TABLET | Freq: Every day | ORAL | Status: DC
  Administered 2016-07-13: 1 mg via ORAL
  Filled 2016-07-12: qty 1

## 2016-07-12 MED ORDER — ONDANSETRON HCL 4 MG/2ML IJ SOLN: 4.0000 mg | Freq: Four times a day (QID) | INTRAMUSCULAR | Status: DC | PRN

## 2016-07-12 MED ORDER — EPHEDRINE SULFATE 50 MG/ML IJ SOLN
INTRAMUSCULAR | Status: DC | PRN
  Administered 2016-07-12: 15 mg via INTRAVENOUS
  Administered 2016-07-12 (×2): 10 mg via INTRAVENOUS

## 2016-07-12 MED ORDER — ONDANSETRON HCL 4 MG/2ML IJ SOLN
INTRAMUSCULAR | Status: DC | PRN
Start: 2016-07-12 — End: 2016-07-12
  Administered 2016-07-12: 4 mg via INTRAVENOUS

## 2016-07-12 MED ORDER — NYSTATIN 100000 UNIT/GM EX CREA
TOPICAL_CREAM | Freq: Two times a day (BID) | CUTANEOUS | Status: DC
  Administered 2016-07-13: 10:00:00 via TOPICAL
  Filled 2016-07-12: qty 15

## 2016-07-12 MED ORDER — PROMETHAZINE HCL 25 MG/ML IJ SOLN: 6.2500 mg | INTRAMUSCULAR | Status: DC | PRN

## 2016-07-12 MED ORDER — DOCUSATE SODIUM 100 MG PO CAPS
100.0000 mg | ORAL_CAPSULE | Freq: Two times a day (BID) | ORAL | Status: DC
  Administered 2016-07-12 – 2016-07-13 (×2): 100 mg via ORAL
  Filled 2016-07-12 (×2): qty 1

## 2016-07-12 MED ORDER — SODIUM CHLORIDE 0.9 % IV SOLN
INTRAVENOUS | Status: DC
  Administered 2016-07-12: 18:00:00 via INTRAVENOUS

## 2016-07-12 MED ORDER — PHENYLEPHRINE HCL 10 MG PO TABS: 10.0000 mg | ORAL_TABLET | Freq: Every day | ORAL | Status: DC

## 2016-07-12 MED ORDER — THROMBIN 5000 UNITS EX SOLR
CUTANEOUS | Status: AC
  Filled 2016-07-12: qty 5000

## 2016-07-12 MED ORDER — ACETAMINOPHEN 325 MG PO TABS: 650.0000 mg | ORAL_TABLET | Freq: Four times a day (QID) | ORAL | Status: DC | PRN

## 2016-07-12 MED ORDER — OMEGA-3-ACID ETHYL ESTERS 1 G PO CAPS
1.0000 g | ORAL_CAPSULE | Freq: Every day | ORAL | Status: DC
  Administered 2016-07-12 – 2016-07-13 (×2): 1 g via ORAL
  Filled 2016-07-12 (×2): qty 1

## 2016-07-12 MED ORDER — PHENOL 1.4 % MT LIQD: 1.0000 | OROMUCOSAL | Status: DC | PRN

## 2016-07-12 MED ORDER — AMPHETAMINE-DEXTROAMPHETAMINE 10 MG PO TABS
15.0000 mg | ORAL_TABLET | Freq: Every day | ORAL | Status: DC
  Administered 2016-07-13: 15 mg via ORAL
  Filled 2016-07-12: qty 2

## 2016-07-12 MED ORDER — PROPOFOL 10 MG/ML IV BOLUS
INTRAVENOUS | Status: DC | PRN
  Administered 2016-07-12: 130 mg via INTRAVENOUS

## 2016-07-12 MED ORDER — FENOFIBRATE 160 MG PO TABS
160.0000 mg | ORAL_TABLET | Freq: Every day | ORAL | Status: DC
  Administered 2016-07-12 – 2016-07-13 (×2): 160 mg via ORAL
  Filled 2016-07-12 (×2): qty 1

## 2016-07-12 MED ORDER — HYDROCODONE-ACETAMINOPHEN 5-325 MG PO TABS: 1.0000 | ORAL_TABLET | ORAL | 0 refills | Status: AC | PRN

## 2016-07-12 MED ORDER — POLYETHYLENE GLYCOL 3350 17 G PO PACK: 17.0000 g | PACK | Freq: Every day | ORAL | Status: DC | PRN

## 2016-07-12 MED ORDER — METOCLOPRAMIDE HCL 5 MG PO TABS: 5.0000 mg | ORAL_TABLET | Freq: Three times a day (TID) | ORAL | Status: DC | PRN

## 2016-07-12 MED ORDER — LIDOCAINE HCL (CARDIAC) 20 MG/ML IV SOLN
INTRAVENOUS | Status: DC | PRN
  Administered 2016-07-12: 20 mg via INTRAVENOUS

## 2016-07-12 MED ORDER — MORPHINE SULFATE (PF) 2 MG/ML IV SOLN
2.0000 mg | INTRAVENOUS | Status: DC | PRN
  Administered 2016-07-13: 2 mg via INTRAVENOUS
  Filled 2016-07-12: qty 1

## 2016-07-12 MED ORDER — OXYBUTYNIN CHLORIDE 5 MG PO TABS
5.0000 mg | ORAL_TABLET | Freq: Two times a day (BID) | ORAL | Status: DC
  Administered 2016-07-12 – 2016-07-13 (×2): 5 mg via ORAL
  Filled 2016-07-12 (×2): qty 1

## 2016-07-12 MED ORDER — SIMETHICONE 80 MG PO CHEW: 80.0000 mg | CHEWABLE_TABLET | Freq: Four times a day (QID) | ORAL | Status: DC | PRN

## 2016-07-12 MED ORDER — PHENYLEPHRINE HCL 10 MG/ML IJ SOLN
INTRAMUSCULAR | Status: DC | PRN
Start: 2016-07-12 — End: 2016-07-12
  Administered 2016-07-12: 80 ug via INTRAVENOUS

## 2016-07-12 MED ORDER — LACTATED RINGERS IV SOLN
INTRAVENOUS | Status: DC
  Administered 2016-07-12 (×2): via INTRAVENOUS

## 2016-07-12 MED ORDER — THROMBIN 5000 UNITS EX SOLR
CUTANEOUS | Status: DC | PRN
  Administered 2016-07-12: 5000 [IU] via TOPICAL

## 2016-07-12 MED ORDER — FENTANYL CITRATE (PF) 100 MCG/2ML IJ SOLN
INTRAMUSCULAR | Status: AC
  Administered 2016-07-12: 100 ug
  Filled 2016-07-12: qty 2

## 2016-07-12 MED ORDER — SUCCINYLCHOLINE CHLORIDE 20 MG/ML IJ SOLN
INTRAMUSCULAR | Status: DC | PRN
  Administered 2016-07-12: 80 mg via INTRAVENOUS

## 2016-07-12 MED ORDER — HYDROCODONE-ACETAMINOPHEN 5-325 MG PO TABS
1.0000 | ORAL_TABLET | ORAL | Status: DC | PRN
  Administered 2016-07-12 – 2016-07-13 (×4): 2 via ORAL
  Filled 2016-07-12 (×4): qty 2

## 2016-07-12 MED ORDER — BUPIVACAINE-EPINEPHRINE (PF) 0.25% -1:200000 IJ SOLN
INTRAMUSCULAR | Status: AC
  Filled 2016-07-12: qty 30

## 2016-07-12 MED ORDER — SODIUM CHLORIDE 0.9 % IV SOLN
INTRAVENOUS | Status: DC | PRN
Start: 2016-07-12 — End: 2016-07-12
  Administered 2016-07-12: 30 ug/min via INTRAVENOUS

## 2016-07-12 SURGICAL SUPPLY — 81 items
BIT DRILL 5/64X5 DISP (BIT) ×3 IMPLANT
BLADE SAW SAG 73X25 THK (BLADE) ×2
BLADE SAW SGTL 73X25 THK (BLADE) ×1 IMPLANT
BOWL SMART MIX CTS (DISPOSABLE) ×3 IMPLANT
BUR SURG 4X8 MED (BURR) IMPLANT
BURR SURG 4MMX8MM MEDIUM (BURR)
BURR SURG 4X8 MED (BURR)
CAPT HIP TOTAL 2 ×3 IMPLANT
CEMENT BONE DEPUY (Cement) ×3 IMPLANT
CLOSURE STERI-STRIP 1/2X4 (GAUZE/BANDAGES/DRESSINGS) ×1
CLOSURE WOUND 1/2 X4 (GAUZE/BANDAGES/DRESSINGS) ×1
CLSR STERI-STRIP ANTIMIC 1/2X4 (GAUZE/BANDAGES/DRESSINGS) ×2 IMPLANT
COVER SURGICAL LIGHT HANDLE (MISCELLANEOUS) ×3 IMPLANT
DRAPE IMP U-DRAPE 54X76 (DRAPES) ×3 IMPLANT
DRAPE INCISE IOBAN 66X45 STRL (DRAPES) ×9 IMPLANT
DRAPE ORTHO SPLIT 77X108 STRL (DRAPES) ×4
DRAPE SURG ORHT 6 SPLT 77X108 (DRAPES) ×2 IMPLANT
DRAPE U-SHAPE 47X51 STRL (DRAPES) ×3 IMPLANT
DRAPE X-RAY CASS 24X20 (DRAPES) IMPLANT
DRSG ADAPTIC 3X8 NADH LF (GAUZE/BANDAGES/DRESSINGS) ×3 IMPLANT
DRSG PAD ABDOMINAL 8X10 ST (GAUZE/BANDAGES/DRESSINGS) ×6 IMPLANT
DURAPREP 26ML APPLICATOR (WOUND CARE) ×3 IMPLANT
ELECT BLADE 4.0 EZ CLEAN MEGAD (MISCELLANEOUS) ×3
ELECT NEEDLE TIP 2.8 STRL (NEEDLE) ×3 IMPLANT
ELECT REM PT RETURN 9FT ADLT (ELECTROSURGICAL) ×3
ELECTRODE BLDE 4.0 EZ CLN MEGD (MISCELLANEOUS) ×1 IMPLANT
ELECTRODE REM PT RTRN 9FT ADLT (ELECTROSURGICAL) ×1 IMPLANT
GAUZE SPONGE 4X4 12PLY STRL (GAUZE/BANDAGES/DRESSINGS) ×3 IMPLANT
GLOVE BIOGEL PI ORTHO PRO 7.5 (GLOVE) ×2
GLOVE BIOGEL PI ORTHO PRO SZ7 (GLOVE) ×2
GLOVE BIOGEL PI ORTHO PRO SZ8 (GLOVE) ×2
GLOVE ORTHO TXT STRL SZ7.5 (GLOVE) ×3 IMPLANT
GLOVE PI ORTHO PRO STRL 7.5 (GLOVE) ×1 IMPLANT
GLOVE PI ORTHO PRO STRL SZ7 (GLOVE) ×1 IMPLANT
GLOVE PI ORTHO PRO STRL SZ8 (GLOVE) ×1 IMPLANT
GLOVE SURG ORTHO 8.5 STRL (GLOVE) ×6 IMPLANT
GLOVE SURG SS PI 7.0 STRL IVOR (GLOVE) ×3 IMPLANT
GOWN STRL REUS W/ TWL XL LVL3 (GOWN DISPOSABLE) ×3 IMPLANT
GOWN STRL REUS W/TWL XL LVL3 (GOWN DISPOSABLE) ×6
HANDPIECE INTERPULSE COAX TIP (DISPOSABLE)
KIT BASIN OR (CUSTOM PROCEDURE TRAY) ×3 IMPLANT
KIT ROOM TURNOVER OR (KITS) ×3 IMPLANT
MANIFOLD NEPTUNE II (INSTRUMENTS) ×3 IMPLANT
NDL SUT 6 .5 CRC .975X.05 MAYO (NEEDLE) ×1 IMPLANT
NEEDLE 1/2 CIR MAYO (NEEDLE) ×3 IMPLANT
NEEDLE HYPO 25GX1X1/2 BEV (NEEDLE) ×3 IMPLANT
NEEDLE MAYO TAPER (NEEDLE) ×2
NS IRRIG 1000ML POUR BTL (IV SOLUTION) ×3 IMPLANT
PACK SHOULDER (CUSTOM PROCEDURE TRAY) ×3 IMPLANT
PACK UNIVERSAL I (CUSTOM PROCEDURE TRAY) ×3 IMPLANT
PAD ABD 8X10 STRL (GAUZE/BANDAGES/DRESSINGS) ×3 IMPLANT
PAD ARMBOARD 7.5X6 YLW CONV (MISCELLANEOUS) ×3 IMPLANT
PIN METAGLENE 2.5 (PIN) ×3 IMPLANT
SET HNDPC FAN SPRY TIP SCT (DISPOSABLE) IMPLANT
SLING ARM IMMOBILIZER LRG (SOFTGOODS) IMPLANT
SLING ARM IMMOBILIZER MED (SOFTGOODS) ×3 IMPLANT
SMARTMIX MINI TOWER (MISCELLANEOUS) ×3
SPONGE LAP 18X18 X RAY DECT (DISPOSABLE) ×3 IMPLANT
SPONGE LAP 4X18 X RAY DECT (DISPOSABLE) ×3 IMPLANT
SPONGE SURGIFOAM ABS GEL SZ50 (HEMOSTASIS) ×3 IMPLANT
STRIP CLOSURE SKIN 1/2X4 (GAUZE/BANDAGES/DRESSINGS) ×2 IMPLANT
SUCTION FRAZIER HANDLE 10FR (MISCELLANEOUS) ×2
SUCTION TUBE FRAZIER 10FR DISP (MISCELLANEOUS) ×1 IMPLANT
SUT BONE WAX W31G (SUTURE) ×3 IMPLANT
SUT FIBERWIRE #2 38 T-5 BLUE (SUTURE) ×6
SUT MNCRL AB 4-0 PS2 18 (SUTURE) ×3 IMPLANT
SUT VIC AB 0 CT1 27 (SUTURE) ×2
SUT VIC AB 0 CT1 27XBRD ANBCTR (SUTURE) ×1 IMPLANT
SUT VIC AB 0 CT2 27 (SUTURE) ×3 IMPLANT
SUT VIC AB 2-0 CT1 27 (SUTURE) ×2
SUT VIC AB 2-0 CT1 TAPERPNT 27 (SUTURE) ×1 IMPLANT
SUT VICRYL AB 2 0 TIES (SUTURE) ×3 IMPLANT
SUTURE FIBERWR #2 38 T-5 BLUE (SUTURE) ×2 IMPLANT
SYR CONTROL 10ML LL (SYRINGE) ×3 IMPLANT
TAPE CLOTH SURG 6X10 WHT LF (GAUZE/BANDAGES/DRESSINGS) ×3 IMPLANT
TOWEL OR 17X24 6PK STRL BLUE (TOWEL DISPOSABLE) ×3 IMPLANT
TOWEL OR 17X26 10 PK STRL BLUE (TOWEL DISPOSABLE) ×3 IMPLANT
TOWER SMARTMIX MINI (MISCELLANEOUS) ×1 IMPLANT
TRAY FOLEY CATH 16FRSI W/METER (SET/KITS/TRAYS/PACK) IMPLANT
WATER STERILE IRR 1000ML POUR (IV SOLUTION) ×3 IMPLANT
YANKAUER SUCT BULB TIP NO VENT (SUCTIONS) ×3 IMPLANT

## 2016-07-12 NOTE — Anesthesia Procedure Notes (Signed)
Anesthesia Regional Block:  Interscalene brachial plexus block  Pre-Anesthetic Checklist: ,, timeout performed, Correct Patient, Correct Site, Correct Laterality, Correct Procedure, Correct Position, site marked, Risks and benefits discussed,  Surgical consent,  Pre-op evaluation,  At surgeon's request and post-op pain management  Laterality: Left  Prep: chloraprep       Needles:  Injection technique: Single-shot  Needle Type: Echogenic Needle     Needle Length: 9cm 9 cm Needle Gauge: 21 and 21 G    Additional Needles:  Procedures: ultrasound guided (picture in chart) and nerve stimulator Interscalene brachial plexus block  Nerve Stimulator or Paresthesia:  Response: deltoid and biceps,   Additional Responses:   Narrative:  Start time: 07/12/2016 11:52 AM End time: 07/12/2016 11:59 AM Injection made incrementally with aspirations every 5 mL.  Performed by: Personally  Anesthesiologist: Marcene DuosFITZGERALD, Rajat Staver

## 2016-07-12 NOTE — Transfer of Care (Signed)
Immediate Anesthesia Transfer of Care Note  Patient: Sydney [name]  Procedure(s) Performed: Procedure(s): LEFT TOTAL SHOULDER ARTHROPLASTY (Left)  Patient Location: PACU  Anesthesia Type:General  Level of Consciousness: awake, alert  and patient cooperative  Airway & Oxygen Therapy: Patient Spontanous Breathing  Post-op Assessment: Report given to RN and Post -op Vital signs reviewed and stable  Post vital signs: Reviewed  Last Vitals:  Vitals:   07/12/16 1200 07/12/16 1203  BP: (!) 178/86 (!) 168/64  Pulse: (!) 57 (!) 55  Resp: 16 12  Temp:      Last Pain:  Vitals:   07/12/16 1053  TempSrc: Oral         Complications: No apparent anesthesia complications

## 2016-07-12 NOTE — Anesthesia Procedure Notes (Signed)
Procedure Name: Intubation Date/Time: 07/12/2016 12:39 PM Performed by: Rosiland OzMEYERS, Sacoya Mcgourty Pre-anesthesia Checklist: Patient identified, Emergency Drugs available, Suction available, Patient being monitored and Timeout performed Patient Re-evaluated:Patient Re-evaluated prior to inductionOxygen Delivery Method: Circle system utilized Preoxygenation: Pre-oxygenation with 100% oxygen Intubation Type: IV induction Ventilation: Mask ventilation without difficulty Laryngoscope Size: Miller and 3 Grade View: Grade II Tube type: Oral Tube size: 7.0 mm Number of attempts: 1 Airway Equipment and Method: Stylet Placement Confirmation: ETT inserted through vocal cords under direct vision,  positive ETCO2 and breath sounds checked- equal and bilateral Secured at: 21 cm Tube secured with: Tape Dental Injury: Teeth and Oropharynx as per pre-operative assessment

## 2016-07-12 NOTE — Anesthesia Preprocedure Evaluation (Addendum)
Anesthesia Evaluation  Patient identified by MRN, date of birth, ID band Patient awake    Reviewed: Allergy & Precautions, NPO status , Patient's Chart, lab work & pertinent test results, reviewed documented beta blocker date and time   Airway Mallampati: II  TM Distance: >3 FB Neck ROM: Full    Dental  (+) Dental Advisory Given   Pulmonary former smoker,    breath sounds clear to auscultation       Cardiovascular hypertension, Pt. on medications and Pt. on home beta blockers  Rhythm:Regular Rate:Normal     Neuro/Psych Anxiety Depression negative neurological ROS     GI/Hepatic Neg liver ROS, Esophageal dysmotility   Endo/Other  negative endocrine ROS  Renal/GU negative Renal ROS     Musculoskeletal  (+) Arthritis ,   Abdominal   Peds  Hematology negative hematology ROS (+)   Anesthesia Other Findings   Reproductive/Obstetrics                            Lab Results  Component Value Date   WBC 7.0 07/11/2016   HGB 11.6 (L) 07/11/2016   HCT 35.4 (L) 07/11/2016   MCV 88.5 07/11/2016   PLT 334 07/11/2016   Lab Results  Component Value Date   CREATININE 0.76 07/11/2016   BUN 11 07/11/2016   NA 142 07/11/2016   K 3.6 07/11/2016   CL 109 07/11/2016   CO2 26 07/11/2016    Anesthesia Physical Anesthesia Plan  ASA: II  Anesthesia Plan: General and Regional   Post-op Pain Management:  Regional for Post-op pain   Induction: Intravenous  Airway Management Planned: Oral ETT  Additional Equipment:   Intra-op Plan:   Post-operative Plan: Extubation in OR  Informed Consent: I have reviewed the patients History and Physical, chart, labs and discussed the procedure including the risks, benefits and alternatives for the proposed anesthesia with the patient or authorized representative who has indicated his/her understanding and acceptance.   Dental advisory given  Plan Discussed  with: CRNA  Anesthesia Plan Comments:         Anesthesia Quick Evaluation

## 2016-07-12 NOTE — Interval H&P Note (Signed)
History and Physical Interval Note:  07/12/2016 12:23 PM  Sydney RainbowKaren A Harsha  has presented today for surgery, with the diagnosis of left shoulder osteoarthritis  The various methods of treatment have been discussed with the patient and family. After consideration of risks, benefits and other options for treatment, the patient has consented to  Procedure(s): LEFT TOTAL SHOULDER ARTHROPLASTY (Left) as a surgical intervention .  The patient's history has been reviewed, patient examined, no change in status, stable for surgery.  I have reviewed the patient's chart and labs.  Questions were answered to the patient's satisfaction.     Boleslaus Holloway,STEVEN R

## 2016-07-12 NOTE — Brief Op Note (Signed)
07/12/2016  2:43 PM  PATIENT:  Sydney [name]  65 y.o. female  PRE-OPERATIVE DIAGNOSIS:  left shoulder osteoarthritis, end stage POST-OPERATIVE DIAGNOSIS:  left shoulder osteoarthritis, end stage  PROCEDURE:  Procedure(s): LEFT TOTAL SHOULDER ARTHROPLASTY (Left) [employer]  SURGEON:  Surgeon(s) and Role:    * Beverely LowSteve Jessamine Barcia, MD - Primary  PHYSICIAN ASSISTANT:   ASSISTANTS:Thomas B Dixon, PA-C   ANESTHESIA:   regional and general  EBL:  Total I/O In: 1000 [I.V.:1000] Out: 150 [Blood:150]  BLOOD ADMINISTERED:none  DRAINS: none   LOCAL MEDICATIONS USED:  MARCAINE     SPECIMEN:  No Specimen  DISPOSITION OF SPECIMEN:  N/A  COUNTS:  YES  TOURNIQUET:  * No tourniquets in log *  DICTATION: .Other Dictation: Dictation Number [id number]  PLAN OF CARE: Admit to inpatient   PATIENT DISPOSITION:  PACU - hemodynamically stable.   Delay start of Pharmacological VTE agent (>24hrs) due to surgical blood loss or risk of bleeding: not applicable

## 2016-07-13 ENCOUNTER — Encounter (HOSPITAL_COMMUNITY): Payer: Self-pay | Admitting: Orthopedic Surgery

## 2016-07-13 LAB — BASIC METABOLIC PANEL
Anion gap: 10 (ref 5–15)
BUN: 7 mg/dL (ref 6–20)
CALCIUM: 8.8 mg/dL — AB (ref 8.9–10.3)
CO2: 25 mmol/L (ref 22–32)
CREATININE: 0.66 mg/dL (ref 0.44–1.00)
Chloride: 103 mmol/L (ref 101–111)
Glucose, Bld: 133 mg/dL — ABNORMAL HIGH (ref 65–99)
Potassium: 3.3 mmol/L — ABNORMAL LOW (ref 3.5–5.1)
Sodium: 138 mmol/L (ref 135–145)

## 2016-07-13 LAB — HEMOGLOBIN AND HEMATOCRIT, BLOOD
HEMATOCRIT: 31.1 % — AB (ref 36.0–46.0)
HEMOGLOBIN: 10.3 g/dL — AB (ref 12.0–15.0)

## 2016-07-13 NOTE — Care Management Note (Signed)
Case Management Note  Patient Details  Name: Sydney [name] MRN: [id number] Date of Birth: 11/20/1950  Subjective/Objective: 65 yr old female s/p left total shoulder arthroplasty.                    Action/Plan: Case manager spoke with patient concerning Home Health needs. Referral for Home Health therapy was called to Ayesha RumpfMary Yonjof, [facility]. Patient states she will be going to her sister's home for recovery: [address], [place], [place] [address] , patient's cell# is [phone].   Expected Discharge Date:   07/13/16               Expected Discharge Plan:  Home w Home Health Services  In-House Referral:  NA  Discharge planning Services  CM Consult  Post Acute Care Choice:  Home Health Choice offered to:  Patient  DME Arranged:  N/A DME Agency:  NA  HH Arranged:  OT, PT HH Agency:  [facility]  Status of Service:  Completed, signed off  If discussed at Long Length of Stay Meetings, dates discussed:    Additional Comments:  Sydney [name], Sydney Mcmanamon Naomi, RN 07/13/2016, 11:08 AM

## 2016-07-13 NOTE — Discharge Instructions (Signed)
Ice to the shoulder as much as you can.  Ok to remove the sling and move the arm as you are able.  Gentle ADLs and minimal weight bearing tolerated.  Keep the incision clean and dry and covered for one week, then ok to get it wet in the shower. Please change bandages to gel bandage (sent home with patient) on Thursday 12 /02/2016, then every three days  Do exercises every hour while awake, pendulums, lap slides and rotation exercises and hand, wrist and elbow AROM  No heavy use of the arm.  Follow up in two weeks Dr Ranell PatrickNorris  [phone]  Home health OT for the shoulder - homebound

## 2016-07-13 NOTE — Anesthesia Postprocedure Evaluation (Signed)
Anesthesia Post Note  Patient: Eugenio HoesKaren A Yerger  Procedure(s) Performed: Procedure(s) (LRB): LEFT TOTAL SHOULDER ARTHROPLASTY (Left)  Patient location during evaluation: PACU Anesthesia Type: General and Regional Level of consciousness: awake and alert Pain management: pain level controlled Vital Signs Assessment: post-procedure vital signs reviewed and stable Respiratory status: spontaneous breathing, nonlabored ventilation, respiratory function stable and patient connected to nasal cannula oxygen Cardiovascular status: blood pressure returned to baseline and stable Postop Assessment: no signs of nausea or vomiting Anesthetic complications: no    Last Vitals:  Vitals:   07/13/16 0018 07/13/16 0545  BP: 112/70 131/70  Pulse: 62 (!) 57  Resp: 17 17  Temp: 36.7 C 37.1 C    Last Pain:  Vitals:   07/13/16 0616  TempSrc:   PainSc: 8                  Kennieth RadFitzgerald, Emilyrose Darrah E

## 2016-07-13 NOTE — Progress Notes (Signed)
Orthopedics Progress Note  Subjective: Some pain last night controlled with pain meds this morning  Objective:  Vitals:   07/13/16 0018 07/13/16 0545  BP: 112/70 131/70  Pulse: 62 (!) 57  Resp: 17 17  Temp: 98.1 F (36.7 C) 98.7 F (37.1 C)    General: Awake and alert  Musculoskeletal: NVI L UE, dressing intact Neurovascularly intact  Lab Results  Component Value Date   WBC 7.0 07/11/2016   HGB 10.3 (L) 07/13/2016   HCT 31.1 (L) 07/13/2016   MCV 88.5 07/11/2016   PLT 334 07/11/2016       Component Value Date/Time   NA 138 07/13/2016 0409   K 3.3 (L) 07/13/2016 0409   CL 103 07/13/2016 0409   CO2 25 07/13/2016 0409   GLUCOSE 133 (H) 07/13/2016 0409   BUN 7 07/13/2016 0409   CREATININE 0.66 07/13/2016 0409   CALCIUM 8.8 (L) 07/13/2016 0409   GFRNONAA >60 07/13/2016 0409   GFRAA >60 07/13/2016 0409    No results found for: INR, PROTIME  Assessment/Plan: POD #1 s/p Procedure(s): LEFT TOTAL SHOULDER ARTHROPLASTY Stable overnight.  Possible D/C later today if home health arranged and patient comfortable and clears PT, OT Dressings to go home with her to be changed tomorrow and then q3days  Viviann SpareSteven R. Ranell PatrickNorris, MD 07/13/2016 7:27 AM

## 2016-07-13 NOTE — Evaluation (Signed)
Physical Therapy Evaluation Patient Details Name: Sydney [name] MRN: [id number] DOB: 05/19/1951 Today's Date: 07/13/2016   History of Present Illness  65 y/o LEFT TOTAL SHOULDER ARTHROPLASTY on 07/12/2016.  Pt reports history of leg length discrepancy, multiple throat surgeries, and restless leg  Clinical Impression  Pt moving well overall. Probable d/c today from hospital with sister and brother in law.  Recommend A on stairs at time of d/c.  If pt is not d/c, will follow acutely to maximize safety with gait and stairs.  No follow up PT needed.    Follow Up Recommendations No PT follow up;Supervision - Intermittent;Supervision for mobility/OOB    Equipment Recommendations  None recommended by PT    Recommendations for Other Services       Precautions / Restrictions Precautions Required Braces or Orthoses: Sling Restrictions Weight Bearing Restrictions: Yes LUE Weight Bearing: Non weight bearing      Mobility  Bed Mobility Overal bed mobility: Needs Assistance Bed Mobility: Supine to Sit     Supine to sit: Min assist     General bed mobility comments: MIN A to get trunk fully upright  Transfers Overall transfer level: Needs assistance Equipment used: None Transfers: Sit to/from [employer];Stand Pivot Transfers Sit to Stand: Supervision Stand pivot transfers: Supervision       General transfer comment: Pt with no reports of dizziness  Ambulation/Gait Ambulation/Gait assistance: Supervision Ambulation Distance (Feet): 100 Feet Assistive device: None Gait Pattern/deviations: Step-through pattern     General Gait Details: Amb with step through pattern and close S.  Stairs Stairs: Yes Stairs assistance: Supervision Stair Management: One rail Left;Sideways;Step to pattern;Forwards Number of Stairs: 3 General stair comments: Did stairs sideways and forward with R UE reaching across.  Educated on having someone with her while doing the stairs  Wheelchair  Mobility    Modified Rankin (Stroke Patients Only)       Balance Overall balance assessment: Needs assistance   Sitting balance-Leahy Scale: Good       Standing balance-Leahy Scale: Fair                               Pertinent Vitals/Pain Pain Assessment: Faces Faces Pain Scale: Hurts little more Pain Location: L shoulder Pain Descriptors / Indicators: Grimacing Pain Intervention(s): Monitored during session;Limited activity within patient's tolerance;Repositioned    Home Living Family/patient expects to be discharged to:: Private residence Living Arrangements: Alone Available Help at Discharge: Family;Available 24 hours/day Type of Home: House Home Access: Level entry     Home Layout: Two level   Additional Comments: Plans on going to sister's (sister is [place] fund[place]N) house and then returning to her 1st apartment after she feels she has improved.    Prior Function Level of Independence: Independent               Hand Dominance        Extremity/Trunk Assessment   Upper Extremity Assessment: Defer to OT evaluation           Lower Extremity Assessment: Overall WFL for tasks assessed         Communication   Communication: No difficulties  Cognition Arousal/Alertness: Awake/alert Behavior During Therapy: WFL for tasks assessed/performed Overall Cognitive Status: Within Functional Limits for tasks assessed                      General Comments General comments (skin integrity, edema, etc.): restless legs noted  in supine    Exercises     Assessment/Plan    PT Assessment Patient needs continued PT services  PT Problem List Decreased activity tolerance;Decreased balance;Decreased mobility          PT Treatment Interventions DME instruction;Gait training;Stair training;Functional mobility training;Balance training    PT Goals (Current goals can be found in the Care Plan section)  Acute Rehab PT Goals Patient Stated Goal: go  home today  PT Goal Formulation: With patient Time For Goal Achievement: 07/16/16 Potential to Achieve Goals: Good    Frequency Min 5X/week   Barriers to discharge        Co-evaluation               End of Session Equipment Utilized During Treatment: Gait belt;Other (comment) (sling) Activity Tolerance: Patient tolerated treatment well Patient left: Other (comment) (with OT in bathroom)           Time: [phone] PT Time Calculation (min) (ACUTE ONLY): 24 min   Charges:   PT Evaluation $PT Eval Low Complexity: 1 Procedure PT Treatments $Gait Training: 8-22 mins   PT G Codes:        Sydney [name],Sydney [name] 07/13/2016, 10:05 AM

## 2016-07-13 NOTE — Op Note (Signed)
NAMAntionette [name]:  [name], Sydney             ACCOUNT NO.:  [id number]  MEDICAL RECORD NO.:  [id number]  LOCATION:                                 FACILITY:  PHYSICIAN:  Sydney BallsSteven R. [name] PatrickNorris, M.D. DATE OF BIRTH:  Feb 21, 1951  DATE OF PROCEDURE:  07/12/2016 DATE OF DISCHARGE:                              OPERATIVE REPORT   PREOP DIAGNOSIS:  Left shoulder end-stage osteoarthritis.  POSTOP DIAGNOSIS:  Left shoulder end-stage osteoarthritis.  PROCEDURE PERFORMED:  Left total shoulder arthroplasty using the DePuy Global Unite System.  ATTENDING SURGEON:  Sydney BallsSteven R. [name] PatrickNorris, MD.  ASSISTANT:  Donnie Coffinhomas B. Dixon, PAC, who scrubbed the entire procedure and necessary for satisfactory completion of surgery.  ANESTHESIA:  General anesthesia was used plus interscalene block.  ESTIMATED BLOOD LOSS:  150 mL.  FLUID REPLACED:  1200 mL crystalloid.  INSTRUMENT COUNTS:  Correct.  COMPLICATIONS:  There were no complications.  ANTIBIOTICS:  Perioperative antibiotics were given.  INDICATIONS:  The patient is a 65 year old female with progressive left shoulder pain secondary to end-stage glenohumeral primary arthritis. The patient has had progressive pain despite conservative management, desires operative treatment to restore function and relieve pain to her shoulder.  She has failed cortisone injections, modified activity, pain medications and now presents with significant interference with activities of daily living with sleep.  She has rest pain and presents desiring the total shoulder replacement.  Risks and benefits discussed in detail with the patient.  Informed consent obtained.  DESCRIPTION OF PROCEDURE:  After an adequate level of anesthesia achieved, the patient was positioned in the modified beach-chair position.  The left shoulder was correctly identified and time-out was called, sterile prep and drape performed.  We entered the patient's shoulder using standard deltopectoral approach starting at  the coracoid process extending down to the anterior humerus, dissection down through subcutaneous tissues.  Cephalic vein was identified, taken laterally with the deltoid, pectoralis taken medially.  Conjoint tendon identified and retracted medially.  We placed our deep retractors.  We tenodesed the biceps in situ sewing with figure-of-eight 0 Vicryl suture into the pec tendon.  We next went ahead and released the subscapularis subperiosteally off the lesser tuberosity tagging with #2 FiberWire suture for repair at the end of surgery.  We progressively externally rotated, released the capsule off the inferior humerus revealing large osteophytes and bone-on-bone arthritis.  We then placed a T-handled Crego elevator and large Crego exposing the humeral head.  We then resected the humeral head in 30 degrees of retroversion.  Once we had done that, we resected the remaining osteophytes, progressively externally rotated to get the osteophytes in the back.  We then went ahead and extended the shoulder, placed a Browne retractor.  We then progressively reamed up from a 6 mm to an 8 mm diameter.  We could not get any bigger than 8 due to some hypertrophy cortex that she had.  We then broached for the size 8 Global Unite Stem.  We then went ahead and trialed the trial stem which seated well with appropriate version.  We then removed that.  We then retracted the humerus posteriorly.  We removed the remaining biceps tendon intra-articular portion.  I did a  360 degree labral resection as well as capsule removal, protected the axillary nerve during this portion of procedure.  We found the center point for the glenoid and drilled a guide pin.  There was no remaining cartilage on the glenoid face.  We then reamed for the 40 mm anchor plate glenoid and then did our peripheral hand reaming.  We drilled our central peg hole and then drilled our 3 peripheral holes off the drill guide.  Once we had our 4  holes in place, we impacted the trial 40 APG glenoid in place.  It seated fully and had excellent coverage.  We removed the trial.  We used Gelfoam soaked with epinephrine on the 3 peripheral holes.  We then used vacuum mixed cement in the 3 peripheral holes and none in the central hole, where the fluted peg would go.  We then impacted the real APG glenoid into place and held until cement hardened.  We checked it and it was nice and secure.  We then went ahead and did impaction grafting with the real stem.  It was a [employer] Unite stem with an 8 body 8 proximal pore coat and used impaction grafting with bone available from the humeral head.  Once that was impacted in place, we trialed with initially a 40 eccentric x 18.  We went down to the 40 eccentric x 15 that gave us the best fit with good __________ 50% posteriorly and inferiorly.  Once we were pleased with that, we selected the real 40 eccentric 15 head and impacted that in position with the eccentricity dialed posteriorly superiorly.  We had excellent bony coverage there.  Rotator cuff was intact.  We then went ahead and irrigated thoroughly, reduced the shoulder and then repaired the subscap anatomically back to bone.  We drilled holes in the bone and placed #2 FiberWire in the lesser tuberosity for anatomic tendon to bone repair. Once that repaired fully the bone and repaired the rotator interval, we ranged the shoulder and had excellent stability and range of motion, no tethering and no restriction in range of motion.  No impingement.  We irrigated the subdeltoid and subacromial interval and then repaired deltopectoral interval with 0 Vicryl suture followed by 2-0 Vicryl subcutaneous closure and 4-0 Monocryl for skin.  Steri-Strips applied followed by sterile dressing.  The patient tolerated surgery well.     Sydney BallsSteven R. [name] PatrickNorris, M.D.   ______________________________ Sydney BallsSteven R. [name] PatrickNorris, M.D.    SRN/MEDQ  D:  07/12/2016  T:   07/13/2016  Job:  [id number]

## 2016-07-13 NOTE — Discharge Summary (Signed)
Physician Discharge Summary   Patient ID: Sydney [name] [place] MRN: [id number] DOB/AGE: 64/01/1951 64 y.o.  Admit date: 07/12/2016 Discharge date: 07/13/2016  Admission Diagnoses:  Active Problems:   S/P shoulder replacement, left   Discharge Diagnoses:  Same   Surgeries: Procedure(s): LEFT TOTAL SHOULDER ARTHROPLASTY on 07/12/2016   Consultants: PT, OT  Discharged Condition: Stable  Hospital Course: Sydney [name] is an 65 y.o. female who was admitted 07/12/2016 with [name] chief complaint of left shoulder pain, and found to have [name] diagnosis of left shoulder arthritis, primary, end staged.  They were brought to the operating room on 07/12/2016 and underwent the above named procedures.    The patient had an uncomplicated hospital course and was stable for discharge.  Recent vital signs:  Vitals:   07/13/16 0018 07/13/16 0545  BP: 112/70 131/70  Pulse: 62 (!) 57  Resp: 17 17  Temp: 98.1 F (36.7 C) 98.7 F (37.1 C)    Recent laboratory studies:  Results for orders placed or performed during the hospital encounter of 07/12/16  Hemoglobin and hematocrit, blood  Result Value Ref Range   Hemoglobin 10.3 (L) 12.0 - 15.0 g/dL   HCT 66.031.1 (L) 63.036.0 - 16.046.0 %  Basic metabolic panel  Result Value Ref Range   Sodium 138 135 - 145 mmol/L   Potassium 3.3 (L) 3.5 - 5.1 mmol/L   Chloride 103 101 - 111 mmol/L   CO2 25 22 - 32 mmol/L   Glucose, Bld 133 (H) 65 - 99 mg/dL   BUN 7 6 - 20 mg/dL   Creatinine, Ser 1.090.66 0.44 - 1.00 mg/dL   Calcium 8.8 (L) 8.9 - 10.3 mg/dL   GFR calc non Af Amer >60 >60 mL/min   GFR calc Af Amer >60 >60 mL/min   Anion gap 10 5 - 15    Discharge Medications:     Medication List    TAKE these medications   acetaminophen 500 MG tablet Commonly known as:  TYLENOL Take 500 mg by mouth every 6 (six) hours as needed for mild pain.   ALPRAZolam 0.25 MG tablet Commonly known as:  XANAX Take 1 tablet by mouth 2 (two) times daily as needed for anxiety.     amphetamine-dextroamphetamine 10 MG tablet Commonly known as:  ADDERALL Take 10-15 mg by mouth See admin instructions. Takes 15 mg in the morning and 10 mg at lunch and 10 mg at 1800   atenolol 50 MG tablet Commonly known as:  TENORMIN Take 50 mg by mouth daily.   cholecalciferol 1000 units tablet Commonly known as:  VITAMIN D Take 1,000 Units by mouth daily.   diltiazem 240 MG 24 hr capsule Commonly known as:  CARDIZEM CD TAKE ONE CAPSULE BY MOUTH IN THE MORNING ON AN EMPTY STOMACH   escitalopram 20 MG tablet Commonly known as:  LEXAPRO Take 20 mg by mouth daily.   estradiol 2 MG tablet Commonly known as:  ESTRACE 1 TABLET ONCE [name] DAY ORALLY 90 DAYS   fenofibrate 145 MG tablet Commonly known as:  TRICOR TAKE 1 TABLET EVERY DAY WITH FOOD   Fish Oil 1200 MG Caps Take 1,200 mg by mouth daily.   HYDROcodone-acetaminophen 5-325 MG tablet Commonly known as:  NORCO/VICODIN TAKE 1 TABLET EVERY 6 HOURS AS NEEDED- MUST LAST 30 DAYS FOR PAIN What changed:  Another medication with the same name was added. Make sure you understand how and when to take each.   HYDROcodone-acetaminophen 5-325 MG tablet Commonly known as:  NORCO Take 1-2 tablets by mouth every 4 (four) hours as needed for moderate pain. What changed:  You were already taking [name] medication with the same name, and this prescription was added. Make sure you understand how and when to take each.   hyoscyamine 0.375 MG 12 hr tablet Commonly known as:  LEVBID Take 0.375 mg by mouth every 12 (twelve) hours.   Magnesium 250 MG Tabs Take 250 mg by mouth daily.   Melatonin 3 MG Caps Take 3 mg by mouth at bedtime.   multivitamin with minerals Tabs tablet Take 1 tablet by mouth daily.   nystatin cream Commonly known as:  MYCOSTATIN DAILY AS NEEDED FOR ITCHING   oxybutynin 5 MG tablet Commonly known as:  DITROPAN 1 TABLET TWICE [name] DAY ORALLY 90 DAYS   phenylephrine 10 MG Tabs tablet Commonly known as:  SUDAFED  PE Take 10 mg by mouth daily.   Potassium 99 MG Tabs Take 99 mg by mouth daily.   ranitidine 150 MG tablet Commonly known as:  ZANTAC Take 150 mg by mouth daily.   simethicone 125 MG chewable tablet Commonly known as:  MYLICON Chew 125 mg by mouth every 6 (six) hours as needed for flatulence.   tiZANidine 4 MG tablet Commonly known as:  ZANAFLEX 1 TABLET AS NEEDED EVERY 8 HRS BY MOUTH 30 FOR MUSCLE CRAMPS What changed:  Another medication with the same name was added. Make sure you understand how and when to take each.   tiZANidine 4 MG tablet Commonly known as:  ZANAFLEX Take 1 tablet (4 mg total) by mouth every 6 (six) hours as needed for muscle spasms. What changed:  You were already taking [name] medication with the same name, and this prescription was added. Make sure you understand how and when to take each.       Diagnostic Studies: Dg Esophagus  Result Date: 07/07/2016 CLINICAL DATA:  Chronic dysphagia. EXAM: ESOPHOGRAM/BARIUM SWALLOW TECHNIQUE: Combined double contrast and single contrast examination performed using effervescent crystals, thick barium liquid, and thin barium liquid. FLUOROSCOPY TIME:  Fluoroscopy Time:  1 minutes and 54 seconds Radiation Exposure Index (if provided by the fluoroscopic device): 10.5 mGy Number of Acquired Spot Images: 0 COMPARISON:  None. FINDINGS: Cervical fusion hardware is in place. No complicating features are identified. Initial barium swallows demonstrate normal pharyngeal motion with swallowing. No laryngeal penetration or aspiration. No upper esophageal webs, strictures or diverticuli. Esophageal dysmotility with disruption of the primary peristaltic wave and frequent tertiary contractions/esophageal spasm. Moderate esophageal stasis. No intrinsic or extrinsic lesions are identified. No hiatal hernia or GE reflux. IMPRESSION: 1. Significant esophageal dysmotility and moderate esophageal stasis. 2. No hiatal hernia or GE reflux. 3. Cervical  fusion hardware in place but no complicating features. No upper esophageal stricture. Electronically Signed   By: Rudie MeyerP.  Gallerani M.D.   On: 07/07/2016 11:55   Dg Shoulder Left Port  Result Date: 07/12/2016 CLINICAL DATA:  Post left shoulder replacement EXAM: LEFT SHOULDER - 1 VIEW COMPARISON:  Left shoulder films of 05/03/2016 FINDINGS: [name] prosthetic left humeral head is now present and in good position. No complicating features are seen. IMPRESSION: Left humeral head replacement in good position. Electronically Signed   By: Dwyane DeePaul  Barry M.D.   On: 07/12/2016 15:40    Disposition: home with home health OT, possible PT if recommended    Follow-up Information    Noel Rodier,STEVEN R, MD. Call in 2 weeks.   Specialty:  Orthopedic Surgery Why:  [phone] Contact  information: [address] [place] [address] [phone]            Signed: Verlee Rossetti 07/13/2016, 7:30 AM

## 2016-07-13 NOTE — Progress Notes (Signed)
Pt discharged to home with belongings, ivs removed. Prescriptions and AVS given. All questions answered. Pt stable a time of discharge.

## 2016-07-13 NOTE — Evaluation (Signed)
Occupational Therapy Evaluation Patient Details Name: Roseanna RainbowKaren A Tarter MRN: [id number] DOB: 03/05/1951 Today's Date: 07/13/2016    History of Present Illness 65 y/o LEFT TOTAL SHOULDER ARTHROPLASTY on 07/12/2016.  Pt reports history of leg length discrepancy, multiple throat surgeries, and restless leg   Clinical Impression   Pt is at min A level with ADLs and sup with ADL mobility. Pt will d/c to her sister's home until she requires less assist. Pt reports dizziness sitting upright in recliner at EOB. Pt educated on L shoulder sling wear, positioning, ADL techniques, ROM exercises allowed (elbow, wrist, hand) and precautions (NWB). All education completed and no further acute OT indicated at this time    Follow Up Recommendations  Home health OT;Supervision - Intermittent;Other (comment) (progress rehab of L shoulder per MD)    Equipment Recommendations  None recommended by OT    Recommendations for Other Services       Precautions / Restrictions Precautions Precautions: Shoulder Shoulder Interventions: Shoulder sling/immobilizer Precaution Booklet Issued: Yes (comment) Precaution Comments: Pt educated on sling wear, precautions and ADL techniques. NO AROM/PROM L SHOULDER Required Braces or Orthoses: Sling Restrictions Weight Bearing Restrictions: Yes LUE Weight Bearing: Non weight bearing      Mobility Bed Mobility Overal bed mobility: Needs Assistance Bed Mobility: Supine to Sit;Sit to Supine     Supine to sit: Supervision Sit to supine: Supervision   General bed mobility comments: MIN A to get trunk fully upright  Transfers Overall transfer level: Needs assistance Equipment used: None Transfers: Sit to/from UGI CorporationStand;Stand Pivot Transfers Sit to Stand: Supervision Stand pivot transfers: Supervision       General transfer comment: Pt reports of dizziness    Balance Overall balance assessment: Needs assistance   Sitting balance-Leahy Scale: Good        Standing balance-Leahy Scale: Fair                              ADL Overall ADL's : Needs assistance/impaired     Grooming: Wash/dry hands;Wash/dry face;Supervision/safety;Set up;Standing   Upper Body Bathing: Minimal assitance   Lower Body Bathing: Minimal assistance   Upper Body Dressing : Minimal assistance   Lower Body Dressing: Minimal assistance   Toilet Transfer: Supervision/safety;Ambulation;Comfort height toilet   Toileting- Clothing Manipulation and Hygiene: Minimal assistance   Tub/ Shower Transfer: Supervision/safety;3 in 1   Functional mobility during ADLs: Supervision/safety General ADL Comments: educated pt on ADL techniques, donning and doffing sling     Vision Vision Assessment?: No apparent visual deficits   Perception     Praxis      Pertinent Vitals/Pain Pain Assessment: 0-10 Pain Score: 8  Faces Pain Scale: Hurts little more Pain Location: L shoulder Pain Descriptors / Indicators: Aching;Sore;Grimacing Pain Intervention(s): Limited activity within patient's tolerance;Monitored during session;Repositioned;Ice applied     Hand Dominance Right   Extremity/Trunk Assessment Upper Extremity Assessment Upper Extremity Assessment: Overall WFL for tasks assessed;LUE deficits/detail LUE Deficits / Details: sling, dressing LUE: Unable to fully assess due to immobilization   Lower Extremity Assessment Lower Extremity Assessment: Defer to PT evaluation   Cervical / Trunk Assessment Cervical / Trunk Assessment: Normal   Communication Communication Communication: No difficulties   Cognition Arousal/Alertness: Awake/alert Behavior During Therapy: WFL for tasks assessed/performed Overall Cognitive Status: Within Functional Limits for tasks assessed                     General Comments  pt pleasant and cooperative           Shoulder Instructions Shoulder Instructions Donning/doffing shirt without moving shoulder:  Minimal assistance;Patient able to independently direct caregiver Method for sponge bathing under operated UE: Minimal assistance;Patient able to independently direct caregiver Donning/doffing sling/immobilizer: Minimal assistance;Patient able to independently direct caregiver Correct positioning of sling/immobilizer: Supervision/safety ROM for elbow, wrist and digits of operated UE: Supervision/safety Sling wearing schedule (on at all times/off for ADL's): Supervision/safety Proper positioning of operated UE when showering: Supervision/safety Positioning of UE while sleeping: Supervision/safety    Home Living Family/patient expects to be discharged to:: Private residence Living Arrangements: Alone Available Help at Discharge: Family;Available 24 hours/day Type of Home: House Home Access: Level entry     Home Layout: Two level Alternate Level Stairs-Number of Steps: flight Alternate Level Stairs-Rails: Left Bathroom Shower/Tub: Tub/shower unit;Walk-in shower   Bathroom Toilet: Standard     Home Equipment: None   Additional Comments: Plans on going to sister's (sister is [employer] fundraiserN) house and then returning to her 1st apartment after she feels she has improved.      Prior Functioning/Environment Level of Independence: Independent                 OT Problem List: Decreased activity tolerance;Pain;Impaired UE functional use   OT Treatment/Interventions:      OT Goals(Current goals can be found in the care plan section) Acute Rehab OT Goals Patient Stated Goal: go home today  OT Goal Formulation: With patient  OT Frequency:     Barriers to D/C:    no barriers                     End of Session    Activity Tolerance: Other (comment) (dizziness) Patient left: in bed;with call bell/phone within reach   Time: 0917-0949 OT Time Calculation (min): 32 min Charges:  OT General Charges $OT Visit: 1 Procedure OT Evaluation $OT Eval Moderate Complexity: 1  Procedure OT Treatments $Self Care/Home Management : 8-22 mins $Therapeutic Activity: 8-22 mins G-Codes:    Galen ManilaSpencer, Mekai Wilkinson Jeanette 07/13/2016, 10:23 AM

## 2016-07-14 DIAGNOSIS — F419 Anxiety disorder, unspecified: Secondary | ICD-10-CM | POA: Diagnosis not present

## 2016-07-14 DIAGNOSIS — Z471 Aftercare following joint replacement surgery: Secondary | ICD-10-CM | POA: Diagnosis not present

## 2016-07-14 DIAGNOSIS — Z96612 Presence of left artificial shoulder joint: Secondary | ICD-10-CM | POA: Diagnosis not present

## 2016-07-14 DIAGNOSIS — Z981 Arthrodesis status: Secondary | ICD-10-CM | POA: Diagnosis not present

## 2016-07-15 DIAGNOSIS — Z981 Arthrodesis status: Secondary | ICD-10-CM | POA: Diagnosis not present

## 2016-07-15 DIAGNOSIS — F419 Anxiety disorder, unspecified: Secondary | ICD-10-CM | POA: Diagnosis not present

## 2016-07-15 DIAGNOSIS — Z96612 Presence of left artificial shoulder joint: Secondary | ICD-10-CM | POA: Diagnosis not present

## 2016-07-15 DIAGNOSIS — Z471 Aftercare following joint replacement surgery: Secondary | ICD-10-CM | POA: Diagnosis not present

## 2016-07-18 DIAGNOSIS — Z471 Aftercare following joint replacement surgery: Secondary | ICD-10-CM | POA: Diagnosis not present

## 2016-07-18 DIAGNOSIS — Z96612 Presence of left artificial shoulder joint: Secondary | ICD-10-CM | POA: Diagnosis not present

## 2016-07-18 DIAGNOSIS — F419 Anxiety disorder, unspecified: Secondary | ICD-10-CM | POA: Diagnosis not present

## 2016-07-18 DIAGNOSIS — Z981 Arthrodesis status: Secondary | ICD-10-CM | POA: Diagnosis not present

## 2016-07-20 DIAGNOSIS — F419 Anxiety disorder, unspecified: Secondary | ICD-10-CM | POA: Diagnosis not present

## 2016-07-20 DIAGNOSIS — Z96612 Presence of left artificial shoulder joint: Secondary | ICD-10-CM | POA: Diagnosis not present

## 2016-07-20 DIAGNOSIS — Z471 Aftercare following joint replacement surgery: Secondary | ICD-10-CM | POA: Diagnosis not present

## 2016-07-20 DIAGNOSIS — Z981 Arthrodesis status: Secondary | ICD-10-CM | POA: Diagnosis not present

## 2016-07-21 DIAGNOSIS — Z981 Arthrodesis status: Secondary | ICD-10-CM | POA: Diagnosis not present

## 2016-07-21 DIAGNOSIS — Z96612 Presence of left artificial shoulder joint: Secondary | ICD-10-CM | POA: Diagnosis not present

## 2016-07-21 DIAGNOSIS — Z471 Aftercare following joint replacement surgery: Secondary | ICD-10-CM | POA: Diagnosis not present

## 2016-07-21 DIAGNOSIS — F419 Anxiety disorder, unspecified: Secondary | ICD-10-CM | POA: Diagnosis not present

## 2016-07-22 DIAGNOSIS — Z471 Aftercare following joint replacement surgery: Secondary | ICD-10-CM | POA: Diagnosis not present

## 2016-07-22 DIAGNOSIS — Z981 Arthrodesis status: Secondary | ICD-10-CM | POA: Diagnosis not present

## 2016-07-22 DIAGNOSIS — F419 Anxiety disorder, unspecified: Secondary | ICD-10-CM | POA: Diagnosis not present

## 2016-07-22 DIAGNOSIS — Z96612 Presence of left artificial shoulder joint: Secondary | ICD-10-CM | POA: Diagnosis not present

## 2016-07-25 DIAGNOSIS — Z96612 Presence of left artificial shoulder joint: Secondary | ICD-10-CM | POA: Diagnosis not present

## 2016-07-25 DIAGNOSIS — Z981 Arthrodesis status: Secondary | ICD-10-CM | POA: Diagnosis not present

## 2016-07-25 DIAGNOSIS — Z471 Aftercare following joint replacement surgery: Secondary | ICD-10-CM | POA: Diagnosis not present

## 2016-07-25 DIAGNOSIS — F419 Anxiety disorder, unspecified: Secondary | ICD-10-CM | POA: Diagnosis not present

## 2016-07-27 DIAGNOSIS — Z96612 Presence of left artificial shoulder joint: Secondary | ICD-10-CM | POA: Diagnosis not present

## 2016-07-27 DIAGNOSIS — Z471 Aftercare following joint replacement surgery: Secondary | ICD-10-CM | POA: Diagnosis not present

## 2016-07-28 DIAGNOSIS — Z981 Arthrodesis status: Secondary | ICD-10-CM | POA: Diagnosis not present

## 2016-07-28 DIAGNOSIS — Z96612 Presence of left artificial shoulder joint: Secondary | ICD-10-CM | POA: Diagnosis not present

## 2016-07-28 DIAGNOSIS — F419 Anxiety disorder, unspecified: Secondary | ICD-10-CM | POA: Diagnosis not present

## 2016-07-28 DIAGNOSIS — Z471 Aftercare following joint replacement surgery: Secondary | ICD-10-CM | POA: Diagnosis not present

## 2016-08-02 DIAGNOSIS — Z471 Aftercare following joint replacement surgery: Secondary | ICD-10-CM | POA: Diagnosis not present

## 2016-08-02 DIAGNOSIS — Z981 Arthrodesis status: Secondary | ICD-10-CM | POA: Diagnosis not present

## 2016-08-02 DIAGNOSIS — F419 Anxiety disorder, unspecified: Secondary | ICD-10-CM | POA: Diagnosis not present

## 2016-08-02 DIAGNOSIS — Z96612 Presence of left artificial shoulder joint: Secondary | ICD-10-CM | POA: Diagnosis not present

## 2016-08-03 DIAGNOSIS — F900 Attention-deficit hyperactivity disorder, predominantly inattentive type: Secondary | ICD-10-CM | POA: Diagnosis not present

## 2016-08-03 DIAGNOSIS — F419 Anxiety disorder, unspecified: Secondary | ICD-10-CM | POA: Diagnosis not present

## 2016-08-03 DIAGNOSIS — I10 Essential (primary) hypertension: Secondary | ICD-10-CM | POA: Diagnosis not present

## 2016-08-03 DIAGNOSIS — M19012 Primary osteoarthritis, left shoulder: Secondary | ICD-10-CM | POA: Diagnosis not present

## 2016-08-04 DIAGNOSIS — Z981 Arthrodesis status: Secondary | ICD-10-CM | POA: Diagnosis not present

## 2016-08-04 DIAGNOSIS — Z96612 Presence of left artificial shoulder joint: Secondary | ICD-10-CM | POA: Diagnosis not present

## 2016-08-04 DIAGNOSIS — F419 Anxiety disorder, unspecified: Secondary | ICD-10-CM | POA: Diagnosis not present

## 2016-08-04 DIAGNOSIS — Z471 Aftercare following joint replacement surgery: Secondary | ICD-10-CM | POA: Diagnosis not present

## 2016-08-09 DIAGNOSIS — Z1211 Encounter for screening for malignant neoplasm of colon: Secondary | ICD-10-CM | POA: Diagnosis not present

## 2016-08-09 DIAGNOSIS — R1314 Dysphagia, pharyngoesophageal phase: Secondary | ICD-10-CM | POA: Diagnosis not present

## 2016-08-10 ENCOUNTER — Other Ambulatory Visit (HOSPITAL_COMMUNITY): Payer: Self-pay | Admitting: Gastroenterology

## 2016-08-10 DIAGNOSIS — R1310 Dysphagia, unspecified: Secondary | ICD-10-CM

## 2016-08-23 ENCOUNTER — Ambulatory Visit (HOSPITAL_COMMUNITY): Payer: Medicare Other

## 2016-08-23 DIAGNOSIS — R300 Dysuria: Secondary | ICD-10-CM | POA: Diagnosis not present

## 2016-08-23 DIAGNOSIS — F5101 Primary insomnia: Secondary | ICD-10-CM | POA: Diagnosis not present

## 2016-08-23 DIAGNOSIS — M25612 Stiffness of left shoulder, not elsewhere classified: Secondary | ICD-10-CM | POA: Diagnosis not present

## 2016-08-23 DIAGNOSIS — N3001 Acute cystitis with hematuria: Secondary | ICD-10-CM | POA: Diagnosis not present

## 2016-08-30 DIAGNOSIS — M25612 Stiffness of left shoulder, not elsewhere classified: Secondary | ICD-10-CM | POA: Diagnosis not present

## 2016-09-01 DIAGNOSIS — M25612 Stiffness of left shoulder, not elsewhere classified: Secondary | ICD-10-CM | POA: Diagnosis not present

## 2016-09-05 DIAGNOSIS — M25612 Stiffness of left shoulder, not elsewhere classified: Secondary | ICD-10-CM | POA: Diagnosis not present

## 2016-09-07 DIAGNOSIS — M25612 Stiffness of left shoulder, not elsewhere classified: Secondary | ICD-10-CM | POA: Diagnosis not present

## 2016-09-13 DIAGNOSIS — Z471 Aftercare following joint replacement surgery: Secondary | ICD-10-CM | POA: Diagnosis not present

## 2016-09-13 DIAGNOSIS — Z96612 Presence of left artificial shoulder joint: Secondary | ICD-10-CM | POA: Diagnosis not present

## 2016-11-01 DIAGNOSIS — F5101 Primary insomnia: Secondary | ICD-10-CM | POA: Diagnosis not present

## 2016-11-01 DIAGNOSIS — K589 Irritable bowel syndrome without diarrhea: Secondary | ICD-10-CM | POA: Diagnosis not present

## 2016-11-01 DIAGNOSIS — F419 Anxiety disorder, unspecified: Secondary | ICD-10-CM | POA: Diagnosis not present

## 2016-11-01 DIAGNOSIS — N951 Menopausal and female climacteric states: Secondary | ICD-10-CM | POA: Diagnosis not present

## 2016-11-01 DIAGNOSIS — E782 Mixed hyperlipidemia: Secondary | ICD-10-CM | POA: Diagnosis not present

## 2016-11-01 DIAGNOSIS — K1379 Other lesions of oral mucosa: Secondary | ICD-10-CM | POA: Diagnosis not present

## 2016-11-01 DIAGNOSIS — N3281 Overactive bladder: Secondary | ICD-10-CM | POA: Diagnosis not present

## 2016-11-01 DIAGNOSIS — F900 Attention-deficit hyperactivity disorder, predominantly inattentive type: Secondary | ICD-10-CM | POA: Diagnosis not present

## 2016-11-01 DIAGNOSIS — I10 Essential (primary) hypertension: Secondary | ICD-10-CM | POA: Diagnosis not present

## 2016-11-09 DIAGNOSIS — Z96612 Presence of left artificial shoulder joint: Secondary | ICD-10-CM | POA: Diagnosis not present

## 2016-11-09 DIAGNOSIS — Z471 Aftercare following joint replacement surgery: Secondary | ICD-10-CM | POA: Diagnosis not present

## 2016-11-10 IMAGING — CR DG SHOULDER 2+V*L*
3 series · 3 of 3 positions shown · non-contrast
Comparison: None.

CLINICAL DATA: Left shoulder pain for 2 months

EXAM:
LEFT SHOULDER - 2+ VIEW

[w shoulder grashey left]
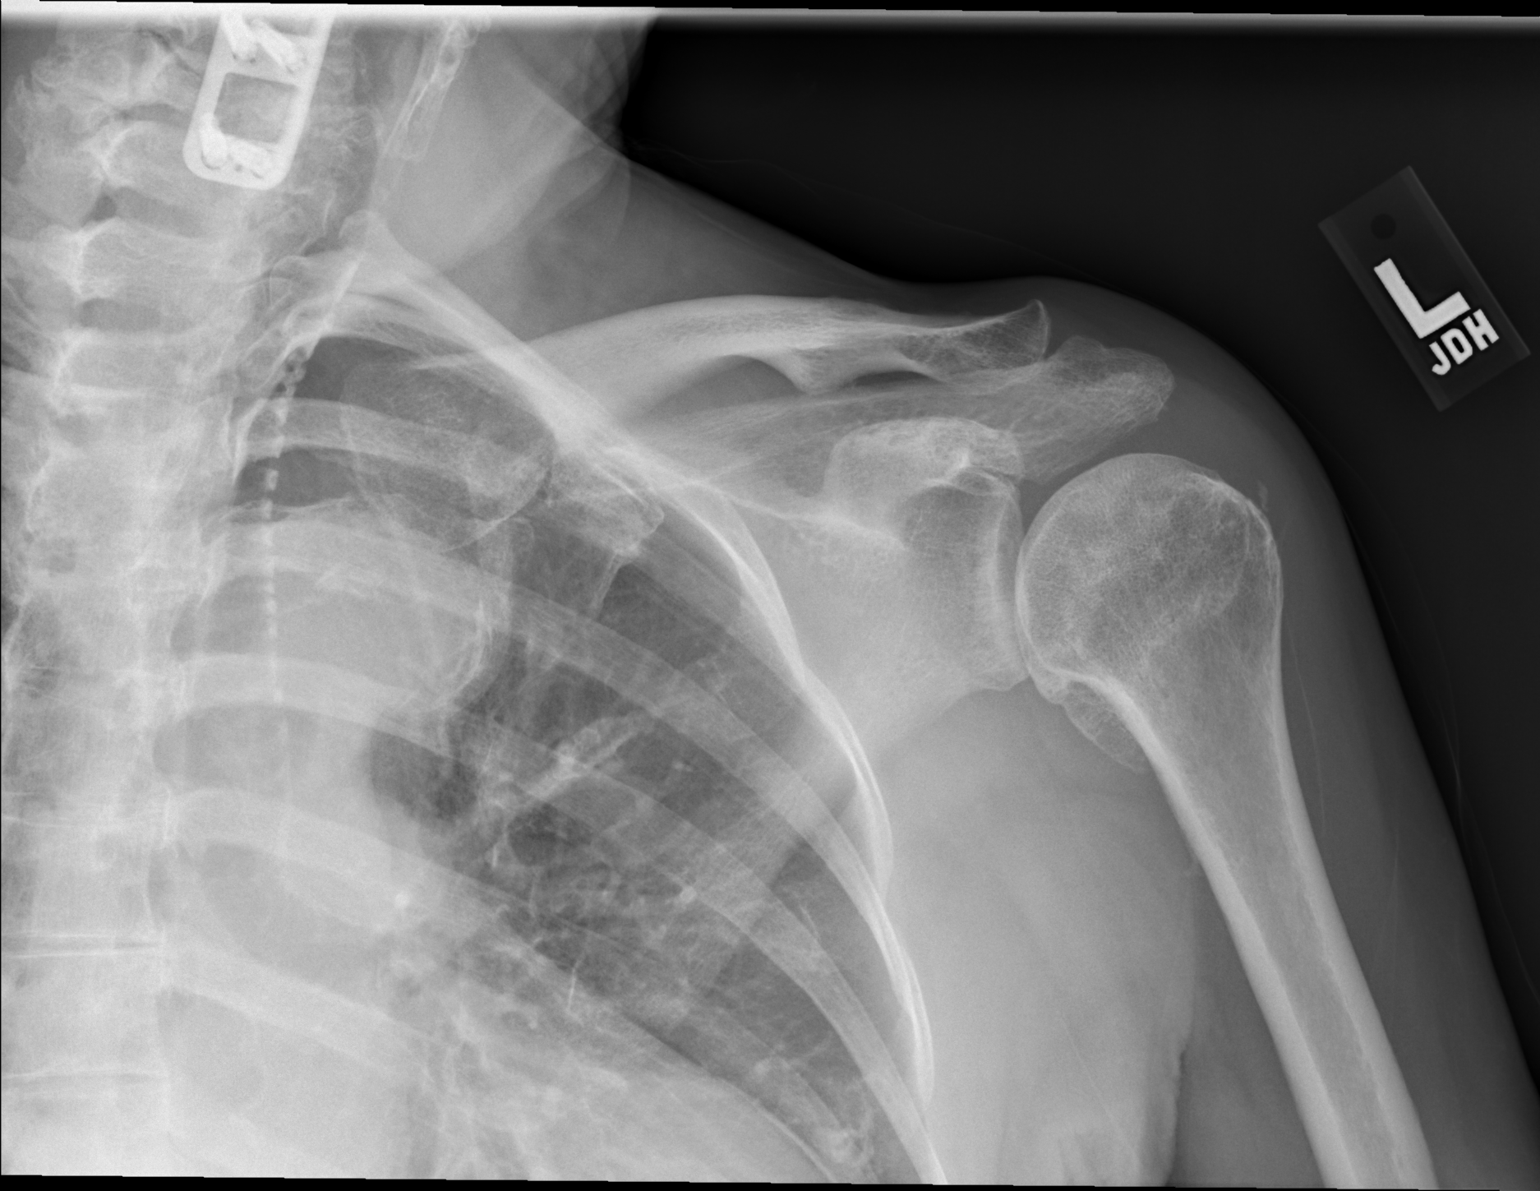

[w shoulder y-view left]
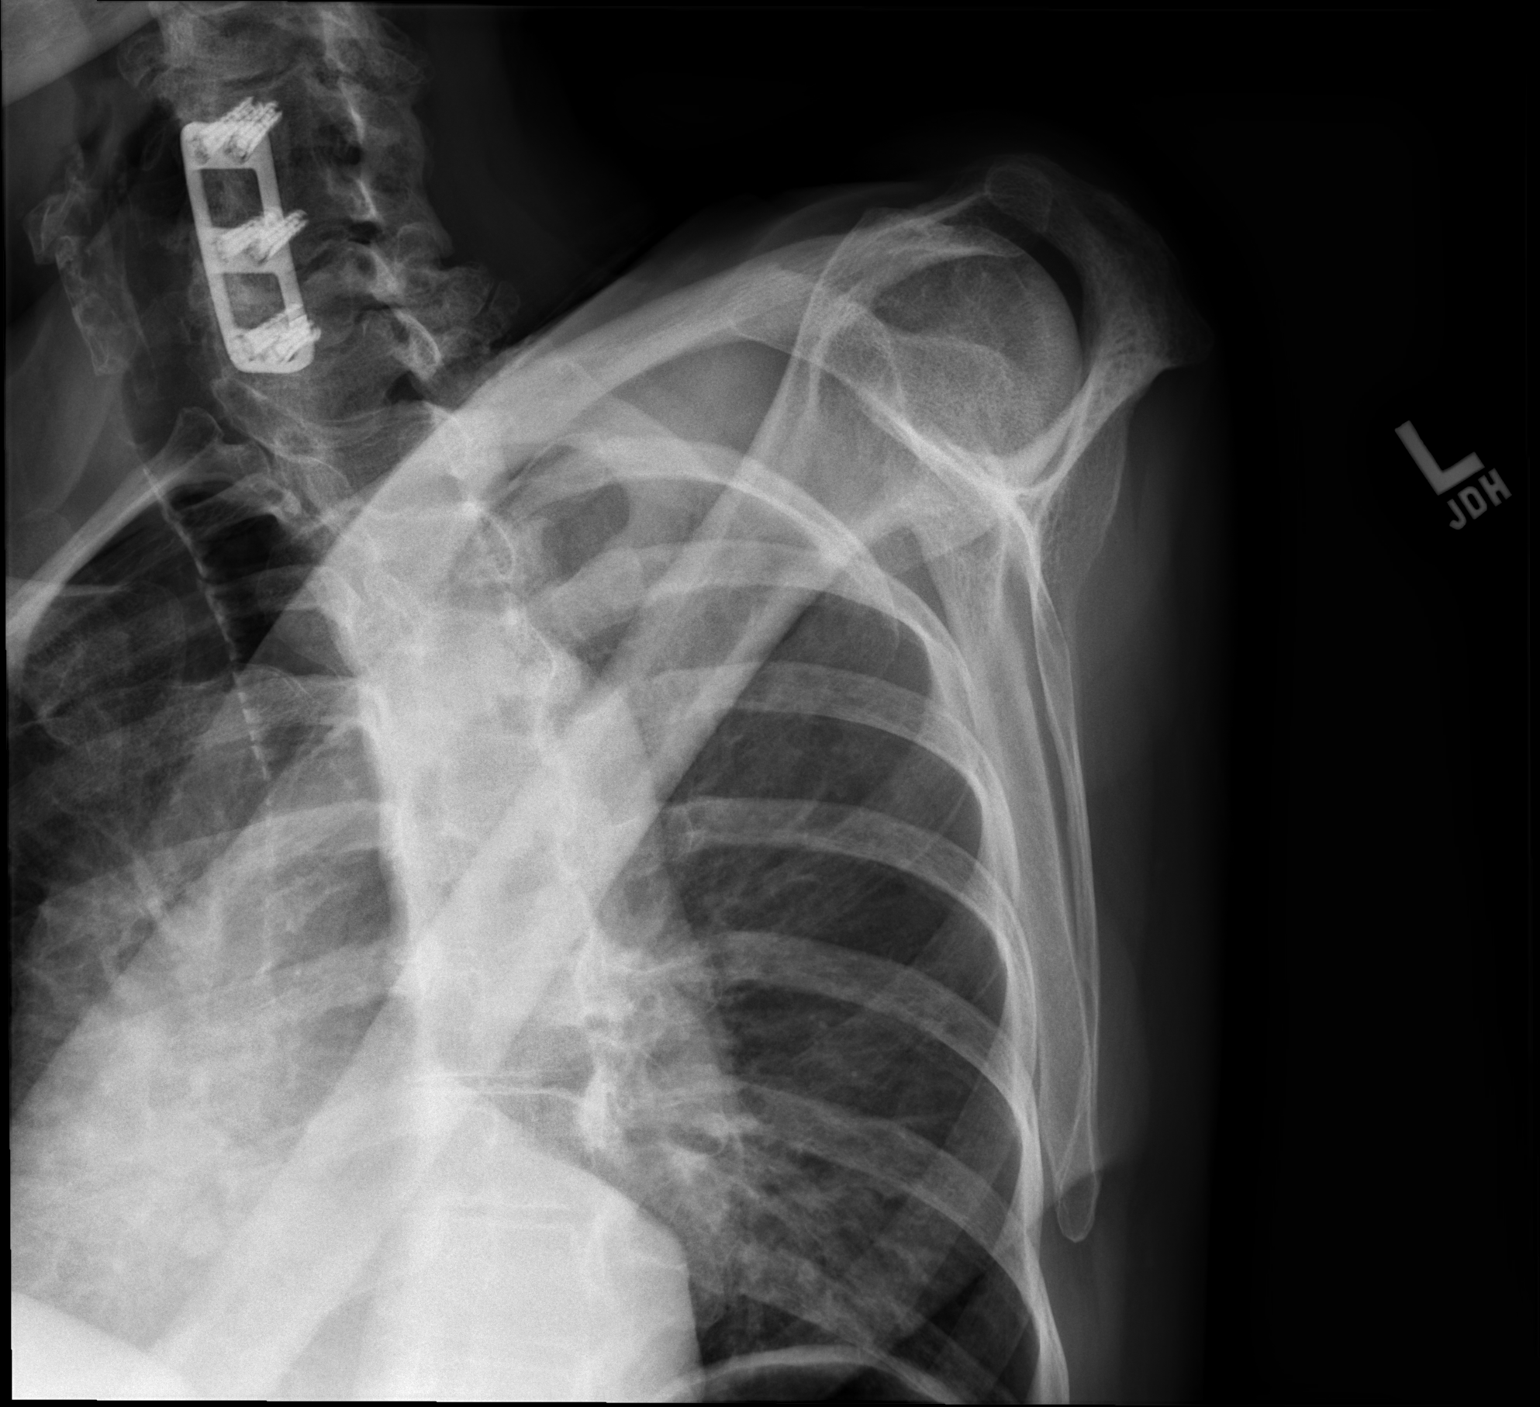

[w shoulder axillary left]
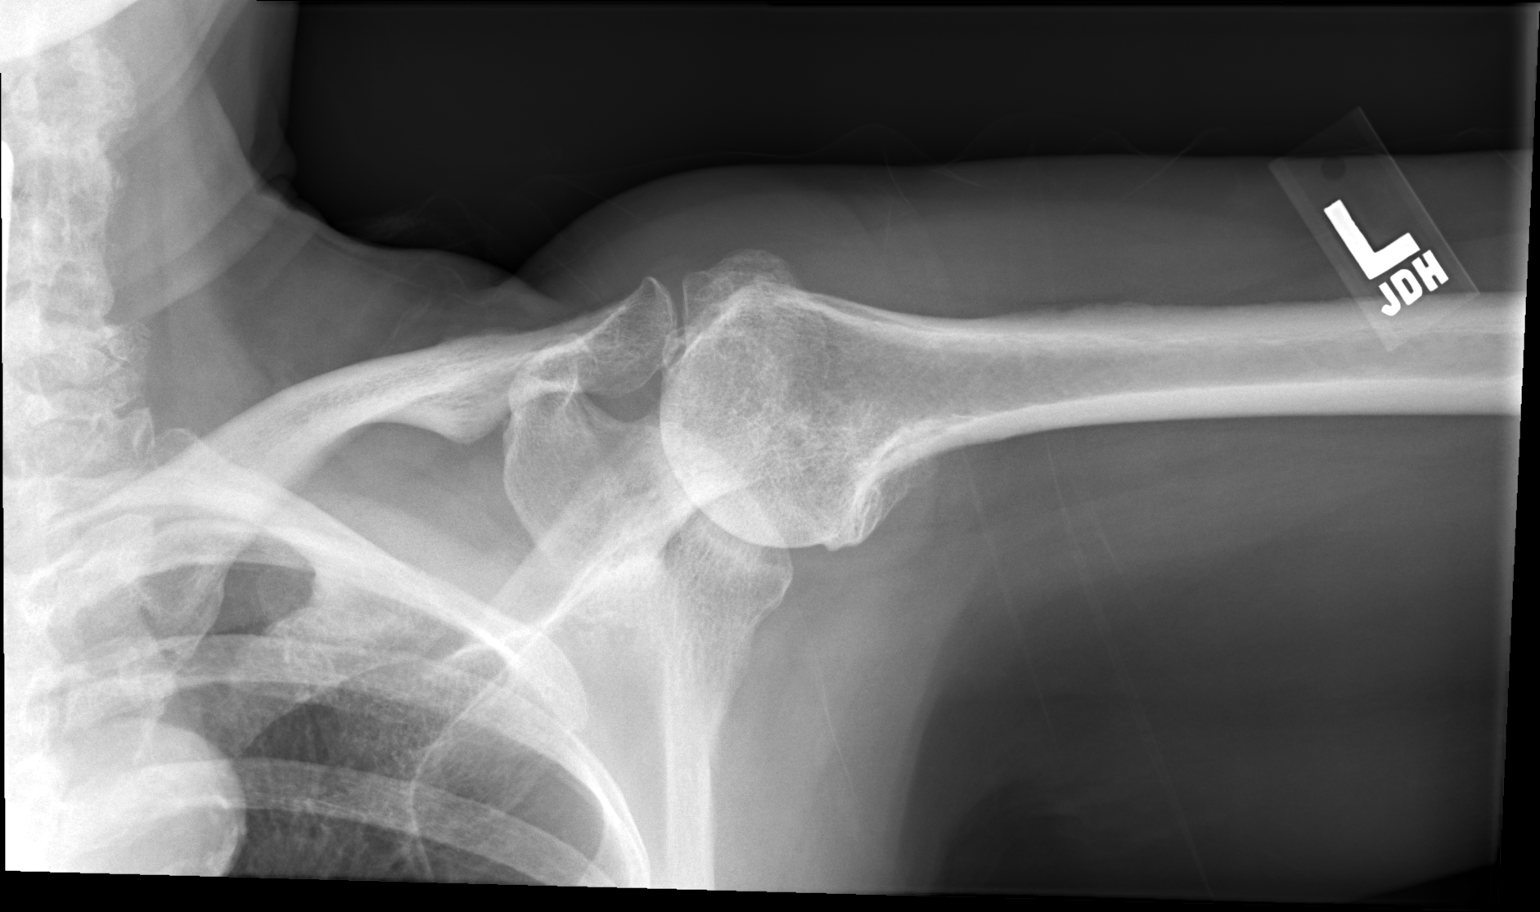

[3 of 3 positions shown; findings below may reference images not displayed]

FINDINGS: Three views of the left shoulder submitted. No acute fracture or
subluxation. There is spurring of humeral head. Narrowing of
glenohumeral joint space. Mild degenerative changes AC joint. Small
calcification adjacent to the humeral head suspicious for calcific
tendinosis.
IMPRESSION: No acute fracture or subluxation. There is spurring of humeral head.
Narrowing of glenohumeral joint space. Mild degenerative changes AC
joint. Small calcification adjacent to the humeral head suspicious
for calcific tendinosis.

## 2016-12-19 DIAGNOSIS — Z471 Aftercare following joint replacement surgery: Secondary | ICD-10-CM | POA: Diagnosis not present

## 2016-12-19 DIAGNOSIS — Z96612 Presence of left artificial shoulder joint: Secondary | ICD-10-CM | POA: Diagnosis not present

## 2016-12-19 DIAGNOSIS — M542 Cervicalgia: Secondary | ICD-10-CM | POA: Diagnosis not present

## 2016-12-28 DIAGNOSIS — R42 Dizziness and giddiness: Secondary | ICD-10-CM | POA: Diagnosis not present

## 2017-01-04 ENCOUNTER — Other Ambulatory Visit (HOSPITAL_COMMUNITY): Payer: Self-pay | Admitting: Gastroenterology

## 2017-01-04 DIAGNOSIS — R1310 Dysphagia, unspecified: Secondary | ICD-10-CM

## 2017-01-12 ENCOUNTER — Inpatient Hospital Stay (HOSPITAL_COMMUNITY): Admission: RE | Admit: 2017-01-12 | Payer: Medicare Other | Source: Ambulatory Visit

## 2017-01-12 ENCOUNTER — Ambulatory Visit (HOSPITAL_COMMUNITY): Payer: Medicare Other

## 2017-04-04 ENCOUNTER — Other Ambulatory Visit: Payer: Self-pay | Admitting: Orthopedic Surgery

## 2017-04-04 DIAGNOSIS — Z96612 Presence of left artificial shoulder joint: Secondary | ICD-10-CM | POA: Diagnosis not present

## 2017-04-04 DIAGNOSIS — M19012 Primary osteoarthritis, left shoulder: Secondary | ICD-10-CM | POA: Diagnosis not present

## 2017-04-04 DIAGNOSIS — M25512 Pain in left shoulder: Secondary | ICD-10-CM

## 2017-04-04 DIAGNOSIS — Z471 Aftercare following joint replacement surgery: Secondary | ICD-10-CM | POA: Diagnosis not present

## 2017-12-05 DIAGNOSIS — R259 Unspecified abnormal involuntary movements: Secondary | ICD-10-CM | POA: Diagnosis not present

## 2017-12-05 DIAGNOSIS — Z6820 Body mass index (BMI) 20.0-20.9, adult: Secondary | ICD-10-CM | POA: Diagnosis not present

## 2017-12-05 DIAGNOSIS — R51 Headache: Secondary | ICD-10-CM | POA: Diagnosis not present

## 2017-12-21 DIAGNOSIS — R259 Unspecified abnormal involuntary movements: Secondary | ICD-10-CM | POA: Diagnosis not present

## 2017-12-21 DIAGNOSIS — R51 Headache: Secondary | ICD-10-CM | POA: Diagnosis not present

## 2017-12-21 DIAGNOSIS — F419 Anxiety disorder, unspecified: Secondary | ICD-10-CM | POA: Diagnosis not present

## 2017-12-28 DIAGNOSIS — R51 Headache: Secondary | ICD-10-CM | POA: Diagnosis not present

## 2018-01-16 DIAGNOSIS — R51 Headache: Secondary | ICD-10-CM | POA: Diagnosis not present

## 2018-01-16 DIAGNOSIS — R259 Unspecified abnormal involuntary movements: Secondary | ICD-10-CM | POA: Diagnosis not present

## 2018-01-16 DIAGNOSIS — F419 Anxiety disorder, unspecified: Secondary | ICD-10-CM | POA: Diagnosis not present

## 2018-01-16 DIAGNOSIS — H9319 Tinnitus, unspecified ear: Secondary | ICD-10-CM | POA: Diagnosis not present

## 2018-01-22 DIAGNOSIS — M19011 Primary osteoarthritis, right shoulder: Secondary | ICD-10-CM | POA: Diagnosis not present

## 2018-01-22 DIAGNOSIS — M75121 Complete rotator cuff tear or rupture of right shoulder, not specified as traumatic: Secondary | ICD-10-CM | POA: Diagnosis not present

## 2018-01-22 DIAGNOSIS — M4722 Other spondylosis with radiculopathy, cervical region: Secondary | ICD-10-CM | POA: Diagnosis not present

## 2018-02-07 DIAGNOSIS — M4722 Other spondylosis with radiculopathy, cervical region: Secondary | ICD-10-CM | POA: Diagnosis not present

## 2018-02-26 DIAGNOSIS — H903 Sensorineural hearing loss, bilateral: Secondary | ICD-10-CM | POA: Diagnosis not present

## 2018-02-26 DIAGNOSIS — R1319 Other dysphagia: Secondary | ICD-10-CM | POA: Diagnosis not present

## 2018-03-07 DIAGNOSIS — K222 Esophageal obstruction: Secondary | ICD-10-CM | POA: Diagnosis not present

## 2018-03-07 DIAGNOSIS — K224 Dyskinesia of esophagus: Secondary | ICD-10-CM | POA: Diagnosis not present

## 2018-03-07 DIAGNOSIS — Q278 Other specified congenital malformations of peripheral vascular system: Secondary | ICD-10-CM | POA: Diagnosis not present
